# Patient Record
Sex: Female | Born: 1935 | Hispanic: Yes | Smoking: Never smoker
Health system: Southern US, Community
[De-identification: ages and names within clinical notes are randomized; demographics above are authoritative.]

## PROBLEM LIST (undated history)

## (undated) DIAGNOSIS — E119 Type 2 diabetes mellitus without complications: Secondary | ICD-10-CM

## (undated) HISTORY — PX: NO PAST SURGERIES: SHX2092

---

## 2010-11-02 ENCOUNTER — Emergency Department (HOSPITAL_COMMUNITY): Payer: Self-pay

## 2010-11-02 ENCOUNTER — Emergency Department (HOSPITAL_COMMUNITY)
Admission: EM | Admit: 2010-11-02 | Discharge: 2010-11-02 | Disposition: A | Discharge status: Home / Self Care | Payer: Self-pay | Attending: Emergency Medicine | Admitting: Emergency Medicine

## 2010-11-02 DIAGNOSIS — R509 Fever, unspecified: Secondary | ICD-10-CM | POA: Insufficient documentation

## 2010-11-02 DIAGNOSIS — N390 Urinary tract infection, site not specified: Secondary | ICD-10-CM | POA: Insufficient documentation

## 2010-11-02 DIAGNOSIS — E119 Type 2 diabetes mellitus without complications: Secondary | ICD-10-CM | POA: Insufficient documentation

## 2010-11-02 DIAGNOSIS — R51 Headache: Secondary | ICD-10-CM | POA: Insufficient documentation

## 2010-11-02 DIAGNOSIS — R079 Chest pain, unspecified: Secondary | ICD-10-CM | POA: Insufficient documentation

## 2010-11-02 DIAGNOSIS — Z79899 Other long term (current) drug therapy: Secondary | ICD-10-CM | POA: Insufficient documentation

## 2010-11-02 DIAGNOSIS — R10813 Right lower quadrant abdominal tenderness: Secondary | ICD-10-CM | POA: Insufficient documentation

## 2010-11-02 LAB — CBC
HCT: 39.1 % (ref 36.0–46.0)
Hemoglobin: 13.8 g/dL (ref 12.0–15.0)
MCH: 30.9 pg (ref 26.0–34.0)
MCV: 87.5 fL (ref 78.0–100.0)
RBC: 4.47 MIL/uL (ref 3.87–5.11)

## 2010-11-02 LAB — DIFFERENTIAL
Eosinophils Absolute: 0 10*3/uL (ref 0.0–0.7)
Lymphocytes Relative: 33 % (ref 12–46)
Lymphs Abs: 1.9 10*3/uL (ref 0.7–4.0)
Monocytes Relative: 5 % (ref 3–12)
Neutro Abs: 3.5 10*3/uL (ref 1.7–7.7)
Neutrophils Relative %: 61 % (ref 43–77)

## 2010-11-02 LAB — URINALYSIS, ROUTINE W REFLEX MICROSCOPIC
Bilirubin Urine: NEGATIVE
Glucose, UA: NEGATIVE mg/dL
Protein, ur: NEGATIVE mg/dL
Urobilinogen, UA: 0.2 mg/dL (ref 0.0–1.0)

## 2010-11-02 LAB — COMPREHENSIVE METABOLIC PANEL
ALT: 16 U/L (ref 0–35)
AST: 20 U/L (ref 0–37)
Albumin: 3.9 g/dL (ref 3.5–5.2)
CO2: 24 mEq/L (ref 19–32)
Chloride: 101 mEq/L (ref 96–112)
GFR calc non Af Amer: >60 mL/min (ref >60)
Potassium: 4 mEq/L (ref 3.5–5.1)
Sodium: 138 mEq/L (ref 135–145)
Total Bilirubin: 0.3 mg/dL (ref 0.3–1.2)

## 2010-11-02 LAB — URINE MICROSCOPIC-ADD ON

## 2010-11-02 LAB — TROPONIN I: Troponin I: <0.3 ng/mL (ref <0.30)

## 2010-11-02 MED ORDER — IOHEXOL 300 MG/ML  SOLN
100.0000 mL | Freq: Once | INTRAMUSCULAR | Status: AC | PRN
  Administered 2010-11-02: 100 mL via INTRAVENOUS

## 2010-11-05 LAB — URINE CULTURE: Culture  Setup Time: 201209300216

## 2012-09-15 IMAGING — CR DG CHEST 2V
2 series · 2 of 2 positions shown · non-contrast
Comparison: None.

CLINICAL DATA: 76-year-old female with nausea and chest pain.

CHEST - 2 VIEW

[w chest lat]
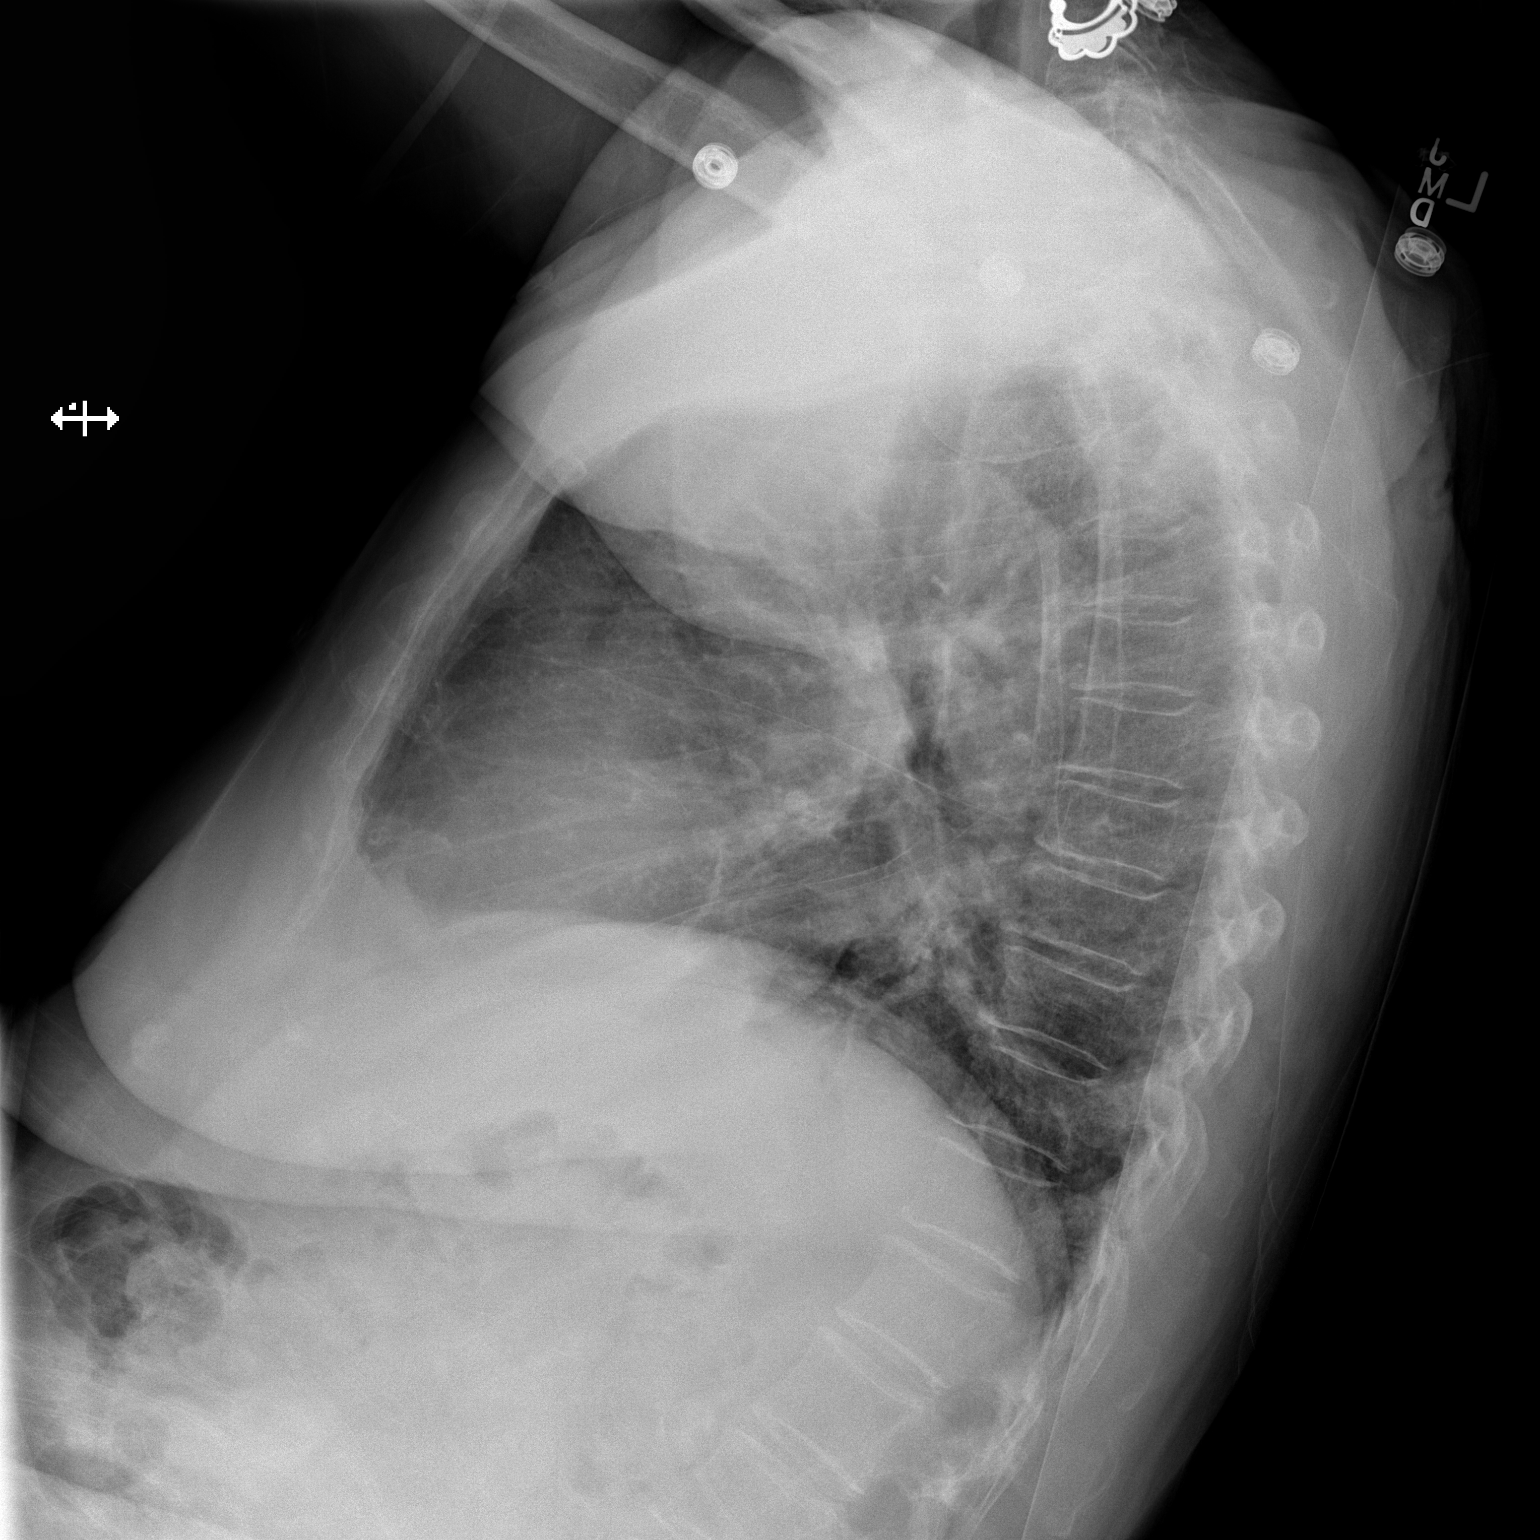

[x chest ap]
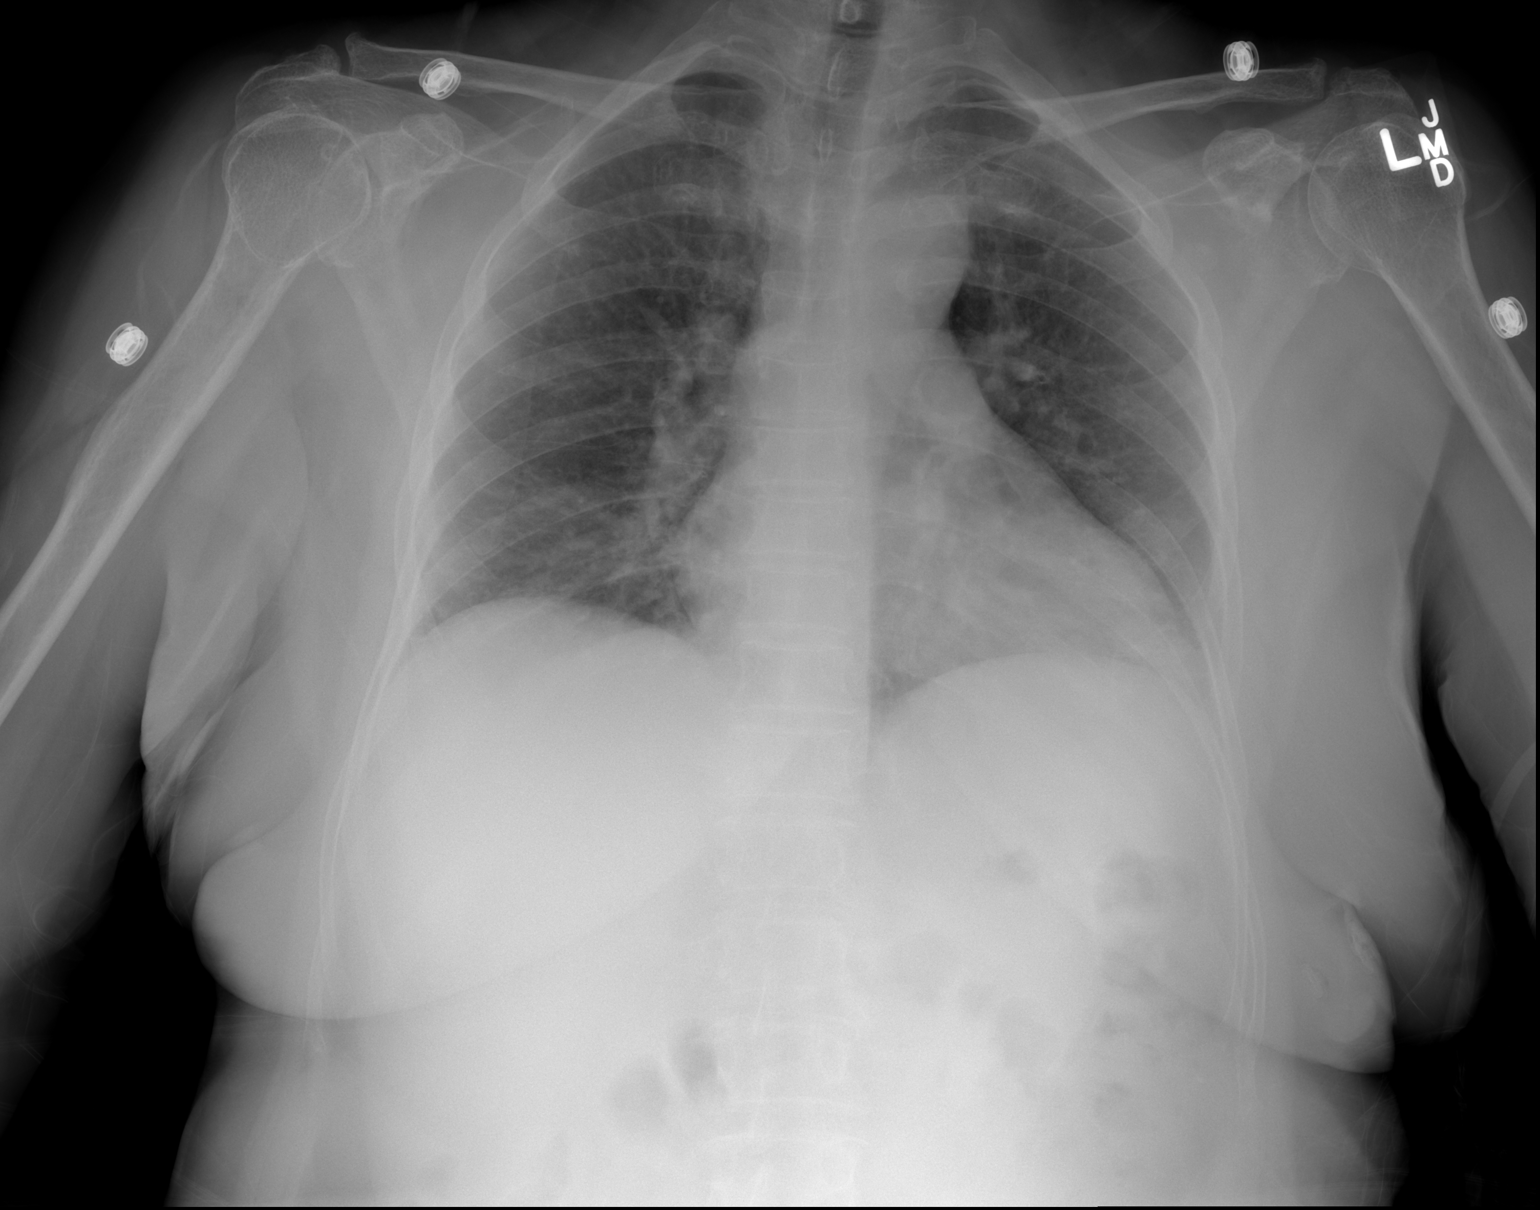

[2 of 2 positions shown; findings below may reference images not displayed]

FINDINGS: Low lung volumes.  Streaky retrocardiac opacity.  Cardiac
size and mediastinal contours are within normal limits.  Visualized
tracheal air column is within normal limits.  No pneumothorax,
pulmonary edema or pleural effusion.  No pneumoperitoneum. No acute
osseous abnormality identified.
IMPRESSION: Low lung volumes with retrocardiac atelectasis.

## 2014-03-14 ENCOUNTER — Inpatient Hospital Stay (HOSPITAL_COMMUNITY): Payer: Self-pay

## 2014-03-14 ENCOUNTER — Inpatient Hospital Stay (HOSPITAL_COMMUNITY)
Admission: AD | Admit: 2014-03-14 | Discharge: 2014-03-14 | Disposition: A | Discharge status: Home / Self Care | Payer: Self-pay | Source: Ambulatory Visit | Attending: Family Medicine | Admitting: Family Medicine

## 2014-03-14 ENCOUNTER — Encounter (HOSPITAL_COMMUNITY): Payer: Self-pay | Admitting: General Practice

## 2014-03-14 DIAGNOSIS — N390 Urinary tract infection, site not specified: Secondary | ICD-10-CM | POA: Insufficient documentation

## 2014-03-14 DIAGNOSIS — R102 Pelvic and perineal pain: Secondary | ICD-10-CM

## 2014-03-14 DIAGNOSIS — R51 Headache: Secondary | ICD-10-CM | POA: Insufficient documentation

## 2014-03-14 DIAGNOSIS — E780 Pure hypercholesterolemia: Secondary | ICD-10-CM | POA: Insufficient documentation

## 2014-03-14 DIAGNOSIS — R319 Hematuria, unspecified: Secondary | ICD-10-CM

## 2014-03-14 DIAGNOSIS — J0100 Acute maxillary sinusitis, unspecified: Secondary | ICD-10-CM | POA: Insufficient documentation

## 2014-03-14 DIAGNOSIS — E119 Type 2 diabetes mellitus without complications: Secondary | ICD-10-CM | POA: Insufficient documentation

## 2014-03-14 DIAGNOSIS — R112 Nausea with vomiting, unspecified: Secondary | ICD-10-CM | POA: Insufficient documentation

## 2014-03-14 HISTORY — DX: Type 2 diabetes mellitus without complications: E11.9

## 2014-03-14 LAB — URINALYSIS, ROUTINE W REFLEX MICROSCOPIC
Bilirubin Urine: NEGATIVE
Glucose, UA: 500 mg/dL — AB
Ketones, ur: 15 mg/dL — AB
NITRITE: NEGATIVE
PROTEIN: NEGATIVE mg/dL
Specific Gravity, Urine: >1.03 — ABNORMAL HIGH (ref 1.005–1.030)
UROBILINOGEN UA: 0.2 mg/dL (ref 0.0–1.0)
pH: 5.5 (ref 5.0–8.0)

## 2014-03-14 LAB — COMPREHENSIVE METABOLIC PANEL
ALBUMIN: 4.4 g/dL (ref 3.5–5.2)
ALK PHOS: 116 U/L (ref 39–117)
ALT: 20 U/L (ref 0–35)
AST: 21 U/L (ref 0–37)
Anion gap: 6 (ref 5–15)
BUN: 18 mg/dL (ref 6–23)
CO2: 27 mmol/L (ref 19–32)
Calcium: 9.3 mg/dL (ref 8.4–10.5)
Chloride: 105 mmol/L (ref 96–112)
Creatinine, Ser: 0.79 mg/dL (ref 0.50–1.10)
GFR calc Af Amer: >90 mL/min (ref >90)
GFR calc non Af Amer: 78 mL/min — ABNORMAL LOW (ref >90)
Glucose, Bld: 217 mg/dL — ABNORMAL HIGH (ref 70–99)
POTASSIUM: 4.1 mmol/L (ref 3.5–5.1)
SODIUM: 138 mmol/L (ref 135–145)
TOTAL PROTEIN: 7.7 g/dL (ref 6.0–8.3)
Total Bilirubin: 0.2 mg/dL — ABNORMAL LOW (ref 0.3–1.2)

## 2014-03-14 LAB — URINE MICROSCOPIC-ADD ON

## 2014-03-14 LAB — CBC WITH DIFFERENTIAL/PLATELET
BASOS ABS: 0 10*3/uL (ref 0.0–0.1)
BASOS PCT: 1 % (ref 0–1)
Eosinophils Absolute: 0.2 10*3/uL (ref 0.0–0.7)
Eosinophils Relative: 4 % (ref 0–5)
HEMATOCRIT: 39.4 % (ref 36.0–46.0)
Hemoglobin: 13.9 g/dL (ref 12.0–15.0)
Lymphocytes Relative: 35 % (ref 12–46)
Lymphs Abs: 1.9 10*3/uL (ref 0.7–4.0)
MCH: 31.8 pg (ref 26.0–34.0)
MCHC: 35.3 g/dL (ref 30.0–36.0)
MCV: 90.2 fL (ref 78.0–100.0)
MONO ABS: 0.6 10*3/uL (ref 0.1–1.0)
Monocytes Relative: 11 % (ref 3–12)
NEUTROS ABS: 2.7 10*3/uL (ref 1.7–7.7)
NEUTROS PCT: 49 % (ref 43–77)
PLATELETS: 273 10*3/uL (ref 150–400)
RBC: 4.37 MIL/uL (ref 3.87–5.11)
RDW: 12.8 % (ref 11.5–15.5)
WBC: 5.4 10*3/uL (ref 4.0–10.5)

## 2014-03-14 LAB — WET PREP, GENITAL
Clue Cells Wet Prep HPF POC: NONE SEEN
Trich, Wet Prep: NONE SEEN
YEAST WET PREP: NONE SEEN

## 2014-03-14 MED ORDER — CEFTRIAXONE SODIUM 1 G IJ SOLR
1.0000 g | Freq: Once | INTRAMUSCULAR | Status: AC
  Administered 2014-03-14: 1 g via INTRAMUSCULAR
  Filled 2014-03-14: qty 10

## 2014-03-14 MED ORDER — AMOXICILLIN-POT CLAVULANATE 875-125 MG PO TABS: 1.0000 | ORAL_TABLET | Freq: Two times a day (BID) | ORAL | Status: DC

## 2014-03-14 MED ORDER — PROMETHAZINE HCL 25 MG PO TABS: 12.5000 mg | ORAL_TABLET | Freq: Four times a day (QID) | ORAL | Status: DC | PRN

## 2014-03-14 MED ORDER — ONDANSETRON 4 MG PO TBDP
4.0000 mg | ORAL_TABLET | Freq: Once | ORAL | Status: AC
  Administered 2014-03-14: 4 mg via ORAL
  Filled 2014-03-14: qty 1

## 2014-03-14 MED ORDER — ACETAMINOPHEN 325 MG PO TABS
650.0000 mg | ORAL_TABLET | Freq: Once | ORAL | Status: AC
  Administered 2014-03-14: 650 mg via ORAL
  Filled 2014-03-14: qty 2

## 2014-03-14 NOTE — Progress Notes (Signed)
I assisted Dr Adrian BlackwaterStinson  With some questions, by Orlan LeavensViria Alvarez Spanish Interpreter

## 2014-03-14 NOTE — MAU Note (Signed)
Pt states headache x 3 days.

## 2014-03-14 NOTE — MAU Note (Addendum)
Has a headache and her eyes hurst, started on Sunday.  Eyes are not red, just watery. No hx of migraines.  Pain in lower abd, burning with urniation started about 3 wks.

## 2014-03-14 NOTE — Progress Notes (Signed)
I assisted RN and Physician with some questions, BY Orlan LeavensViria Alvarez Spanish Interpreter

## 2014-03-14 NOTE — Progress Notes (Signed)
I assisted Dr Mick SellHope Neesse and nurse with some discharges information, by Orlan LeavensViria Alvarez Spanish Interpreter

## 2014-03-14 NOTE — MAU Provider Note (Signed)
CSN: [id number]     Arrival date & time 03/14/14  1617 History   None    Chief Complaint  Patient presents with  . Headache  . Abdominal Pain     (Consider location/radiation/quality/duration/timing/severity/associated sxs/prior Treatment) Patient is a 80 y.o. female presenting with abdominal pain. The history is provided by the patient. The history is limited by a language barrier. A language interpreter was used.  Abdominal Pain The primary symptoms of the illness include abdominal pain, fever, nausea, vomiting and dysuria. The primary symptoms of the illness do not include shortness of breath, vaginal discharge or vaginal bleeding. The current episode started 2 days ago. The onset of the illness was gradual.  The dysuria is associated with frequency and urgency.  The patient states that she believes she is currently not pregnant. The patient has not had a change in bowel habit. Additional symptoms associated with the illness include chills, urgency and frequency. Symptoms associated with the illness do not include back pain. Significant associated medical issues include diabetes.   Leydi Bones is a 80 y.o. female who presents to the MAU with abdominal pain, fever and vomiting that started yesterday. She has been able to keep down some food today. She states that she has also had headache and swelling of her face. She does not have a PCP. She gets her care in the ED's or when she goes to [place]. PMH IDDD, hypercholesterolemia  Past Medical History  Diagnosis Date  . Diabetes mellitus without complication    Past Surgical History  Procedure Laterality Date  . No past surgeries     History reviewed. No pertinent family history. History  Substance Use Topics  . Smoking status: Never Smoker   . Smokeless tobacco: Never Used  . Alcohol Use: No   OB History    Gravida Para Term Preterm AB TAB SAB Ectopic Multiple Living            1     Review of Systems  Constitutional:  Positive for fever and chills.  HENT: Positive for congestion and sinus pressure.   Eyes: Negative for pain and redness.  Respiratory: Positive for cough. Negative for shortness of breath.   Gastrointestinal: Positive for nausea, vomiting and abdominal pain.  Genitourinary: Positive for dysuria, urgency and frequency. Negative for vaginal bleeding and vaginal discharge.  Musculoskeletal: Negative for back pain.  Skin: Negative for rash.  Neurological: Negative for dizziness. Headaches: sinus.  Psychiatric/Behavioral: Negative for confusion. The patient is not nervous/anxious.       Allergies  Review of patient's allergies indicates no known allergies.  Home Medications   Prior to Admission medications   Medication Sig Start Date End Date Taking? Authorizing Provider  glyBURIDE (DIABETA) 5 MG tablet Take 5 mg by mouth 2 (two) times daily with a meal.   Yes Historical Provider, MD  ibuprofen (ADVIL,MOTRIN) 200 MG tablet Take 400 mg by mouth 2 (two) times daily as needed for headache.   Yes Historical Provider, MD  metFORMIN (GLUCOPHAGE) 850 MG tablet Take 850 mg by mouth 2 (two) times daily with a meal.   Yes Historical Provider, MD  Pseudoephedrine-APAP-DM (TYLENOL FLU PO) Take 15 mLs by mouth daily as needed (For flu symptoms.).   Yes Historical Provider, MD  amoxicillin-clavulanate (AUGMENTIN) 875-125 MG per tablet Take 1 tablet by mouth 2 (two) times daily. 03/14/14   Hope Orlene Och, NP  promethazine (PHENERGAN) 25 MG tablet Take 0.5 tablets (12.5 mg total) by mouth every 6 (six)  hours as needed for nausea. 03/14/14   Hope Orlene Och, NP   BP 118/62 mmHg  Pulse 82  Temp(Src) 98.2 F (36.8 C) (Oral)  Resp 18  SpO2 99% Physical Exam  Constitutional: She is oriented to person, place, and time. She appears well-developed and well-nourished. No distress.  HENT:  Head: Normocephalic.  Nose: Right sinus exhibits maxillary sinus tenderness. Left sinus exhibits maxillary sinus tenderness.   Mouth/Throat: Uvula is midline, oropharynx is clear and moist and mucous membranes are normal.  Eyes: Conjunctivae and EOM are normal.  Neck: Normal range of motion. Neck supple.  Cardiovascular: Normal rate and regular rhythm.   Pulmonary/Chest: Effort normal. She has no wheezes. She has no rales.  Abdominal: Soft. Bowel sounds are normal. There is tenderness in the suprapubic area. There is no rebound, no guarding and no CVA tenderness.  Genitourinary:  Strong smell of urine. External genitalia without lesions, scant d/c vaginal vault. No CMT, mildly tender bilateral adnexa. No uterine enlargement.   Musculoskeletal: Normal range of motion.  Neurological: She is alert and oriented to person, place, and time. No cranial nerve deficit.  Skin: Skin is warm and dry.  Psychiatric: She has a normal mood and affect. Her behavior is normal.  Nursing note and vitals reviewed.   ED Course  Procedures  Results for orders placed or performed during the hospital encounter of 03/14/14 (from the past 24 hour(s))  Urinalysis, Routine w reflex microscopic     Status: Abnormal   Collection Time: 03/14/14  4:45 PM  Result Value Ref Range   Color, Urine YELLOW YELLOW   APPearance HAZY (A) CLEAR   Specific Gravity, Urine >1.030 (H) 1.005 - 1.030   pH 5.5 5.0 - 8.0   Glucose, UA 500 (A) NEGATIVE mg/dL   Hgb urine dipstick MODERATE (A) NEGATIVE   Bilirubin Urine NEGATIVE NEGATIVE   Ketones, ur 15 (A) NEGATIVE mg/dL   Protein, ur NEGATIVE NEGATIVE mg/dL   Urobilinogen, UA 0.2 0.0 - 1.0 mg/dL   Nitrite NEGATIVE NEGATIVE   Leukocytes, UA MODERATE (A) NEGATIVE  Urine microscopic-add on     Status: Abnormal   Collection Time: 03/14/14  4:45 PM  Result Value Ref Range   Squamous Epithelial / LPF MANY (A) RARE   WBC, UA TOO NUMEROUS TO COUNT <3 WBC/hpf   RBC / HPF 3-6 <3 RBC/hpf   Bacteria, UA MANY (A) RARE  CBC with Differential/Platelet     Status: None   Collection Time: 03/14/14  4:53 PM  Result  Value Ref Range   WBC 5.4 4.0 - 10.5 K/uL   RBC 4.37 3.87 - 5.11 MIL/uL   Hemoglobin 13.9 12.0 - 15.0 g/dL   HCT 16.1 09.6 - 04.5 %   MCV 90.2 78.0 - 100.0 fL   MCH 31.8 26.0 - 34.0 pg   MCHC 35.3 30.0 - 36.0 g/dL   RDW 40.9 81.1 - 91.4 %   Platelets 273 150 - 400 K/uL   Neutrophils Relative % 49 43 - 77 %   Neutro Abs 2.7 1.7 - 7.7 K/uL   Lymphocytes Relative 35 12 - 46 %   Lymphs Abs 1.9 0.7 - 4.0 K/uL   Monocytes Relative 11 3 - 12 %   Monocytes Absolute 0.6 0.1 - 1.0 K/uL   Eosinophils Relative 4 0 - 5 %   Eosinophils Absolute 0.2 0.0 - 0.7 K/uL   Basophils Relative 1 0 - 1 %   Basophils Absolute 0.0 0.0 - 0.1 K/uL  Comprehensive metabolic panel     Status: Abnormal   Collection Time: 03/14/14  4:53 PM  Result Value Ref Range   Sodium 138 135 - 145 mmol/L   Potassium 4.1 3.5 - 5.1 mmol/L   Chloride 105 96 - 112 mmol/L   CO2 27 19 - 32 mmol/L   Glucose, Bld 217 (H) 70 - 99 mg/dL   BUN 18 6 - 23 mg/dL   Creatinine, Ser 1.610.79 0.50 - 1.10 mg/dL   Calcium 9.3 8.4 - 09.610.5 mg/dL   Total Protein 7.7 6.0 - 8.3 g/dL   Albumin 4.4 3.5 - 5.2 g/dL   AST 21 0 - 37 U/L   ALT 20 0 - 35 U/L   Alkaline Phosphatase 116 39 - 117 U/L   Total Bilirubin 0.2 (L) 0.3 - 1.2 mg/dL   GFR calc non Af Amer 78 (L) >90 mL/min   GFR calc Af Amer >90 >90 mL/min   Anion gap 6 5 - 15  Wet prep, genital     Status: Abnormal   Collection Time: 03/14/14  5:20 PM  Result Value Ref Range   Yeast Wet Prep HPF POC NONE SEEN NONE SEEN   Trich, Wet Prep NONE SEEN NONE SEEN   Clue Cells Wet Prep HPF POC NONE SEEN NONE SEEN   WBC, Wet Prep HPF POC FEW (A) NONE SEEN    Dg Chest 2 View  03/14/2014   CLINICAL DATA:  Cough and fever  EXAM: CHEST  2 VIEW  COMPARISON:  11/02/2010  FINDINGS: Normal heart size and mediastinal contours. Chronic subtle density at the peripheral left base, scarring based on abdominal CT 11/02/2010. No acute infiltrate or edema. No effusion or pneumothorax. No acute osseous findings.   IMPRESSION: No active cardiopulmonary disease.   Electronically Signed   By: Marnee SpringJonathon  Watts M.D.   On: 03/14/2014 20:06   [place] Transvaginal Non-ob  03/14/2014   CLINICAL DATA:  Pelvic pain for 1 month.  EXAM: TRANSABDOMINAL AND TRANSVAGINAL ULTRASOUND OF PELVIS  TECHNIQUE: Both transabdominal and transvaginal ultrasound examinations of the pelvis were performed. Transabdominal technique was performed for global imaging of the pelvis including uterus, ovaries, adnexal regions, and pelvic cul-de-sac. It was necessary to proceed with endovaginal exam following the transabdominal exam to visualize the endometrium and ovaries.  COMPARISON:  None  FINDINGS: Uterus  Measurements: 5.7 x 2.5 x 2.0 cm. No fibroids or other mass visualized.  Endometrium  Thickness: 3 mm which is within normal limit. No focal abnormality visualized.  Right ovary  Not visualized.  No adnexal mass or other abnormality seen.  Left ovary  Not visualized.  No adnexal mass or other abnormality seen.  Other findings  No free fluid.  IMPRESSION: Ovaries are not visualized. No adnexal mass or other abnormality seen. No significant abnormality is noted in the pelvis.   Electronically Signed   By: Lupita RaiderJames  Green Jr, M.D.   On: 03/14/2014 18:14   [place] Pelvis Complete  03/14/2014   CLINICAL DATA:  Pelvic pain for 1 month.  EXAM: TRANSABDOMINAL AND TRANSVAGINAL ULTRASOUND OF PELVIS  TECHNIQUE: Both transabdominal and transvaginal ultrasound examinations of the pelvis were performed. Transabdominal technique was performed for global imaging of the pelvis including uterus, ovaries, adnexal regions, and pelvic cul-de-sac. It was necessary to proceed with endovaginal exam following the transabdominal exam to visualize the endometrium and ovaries.  COMPARISON:  None  FINDINGS: Uterus  Measurements: 5.7 x 2.5 x 2.0 cm. No fibroids or other mass visualized.  Endometrium  Thickness: 3 mm which is within normal limit. No focal abnormality visualized.  Right ovary  Not  visualized.  No adnexal mass or other abnormality seen.  Left ovary  Not visualized.  No adnexal mass or other abnormality seen.  Other findings  No free fluid.  IMPRESSION: Ovaries are not visualized. No adnexal mass or other abnormality seen. No significant abnormality is noted in the pelvis.   Electronically Signed   By: Lupita Raider, M.D.   On: 03/14/2014 18:14    This patient was examined by me and by Dr. Adrian Blackwater. Treated with Rocephin 1 gram IM and tylenol prior to d/c. Tylenol did help her headache. She has been able to eat and drink without n/v here in MAU.  MDM  80 y.o. female with UTI symptoms, n/v (that has resolved) and sinus headache x 2 days. Stable for d/c with normal pelvic u/s, normal chest x-ray and labs. Patient is not toxic appearing, does not appear dehydrated and has no CVA tenderness.  Urine pos for UTI. Will treat out patient with Augmentin 875 PO BID and phenergan 12.5 mg PO for nausea if needed. I have reviewed this patient's vital signs, nurses notes, appropriate labs and imaging.  Discussed with the patient and her daughter results and plan of care using the Spanish translator here in the hospital. Patient voices understanding and agrees with plan. I discussed importance of having a PCP here in the [place] and patient given information about [facility].  Final diagnoses:  Urinary tract infection with hematuria, site unspecified  Acute maxillary sinusitis, recurrence not specified

## 2014-03-16 LAB — URINE CULTURE

## 2015-06-04 DEATH — deceased

## 2016-01-26 IMAGING — DX DG CHEST 2V
2 series · 2 of 2 positions shown · non-contrast
Comparison: 11/02/2010

CLINICAL DATA: Cough and fever

EXAM:
CHEST  2 VIEW

[chest pa]
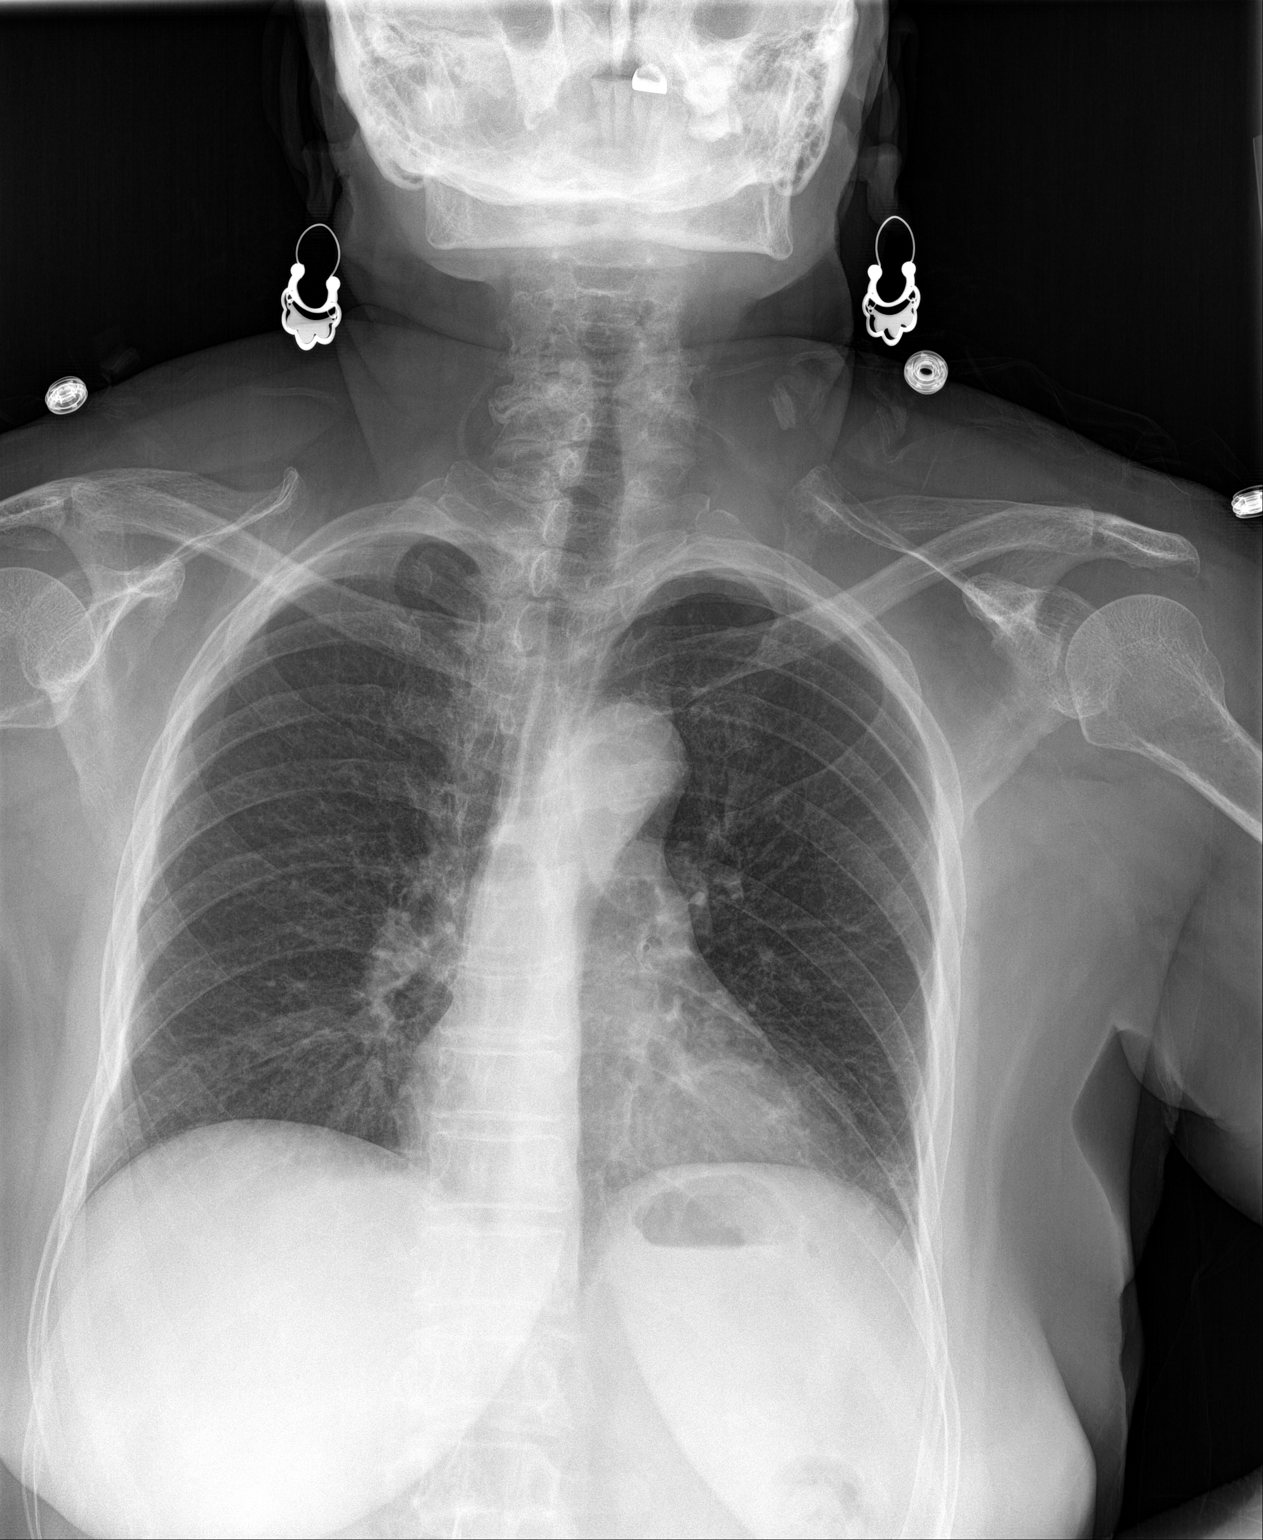

[chest lat]
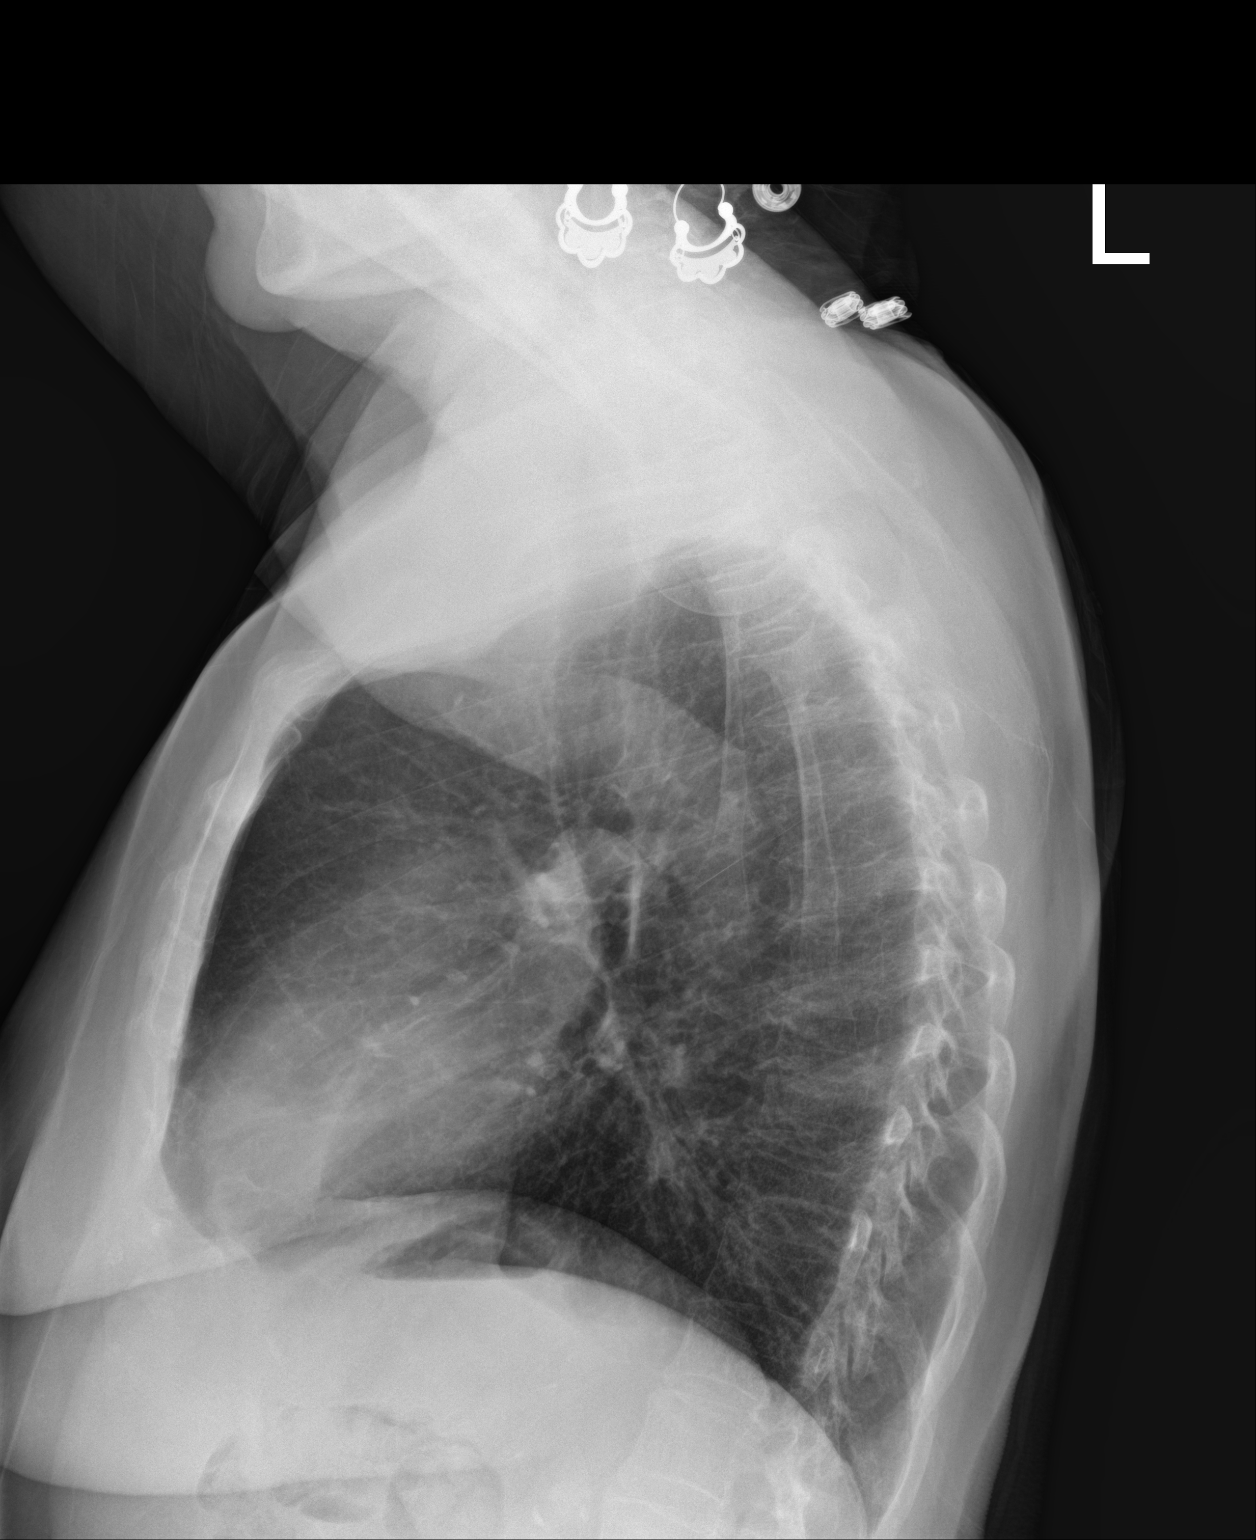

[2 of 2 positions shown; findings below may reference images not displayed]

FINDINGS: Normal heart size and mediastinal contours. Chronic subtle density
at the peripheral left base, scarring based on abdominal CT
11/02/2010. No acute infiltrate or edema. No effusion or
pneumothorax. No acute osseous findings.
IMPRESSION: No active cardiopulmonary disease.

## 2020-09-13 ENCOUNTER — Emergency Department (HOSPITAL_COMMUNITY): Payer: No Typology Code available for payment source

## 2020-09-13 ENCOUNTER — Emergency Department (HOSPITAL_COMMUNITY)
Admission: EM | Admit: 2020-09-13 | Discharge: 2020-09-13 | Disposition: A | Discharge status: Home / Self Care | Payer: No Typology Code available for payment source | Attending: Emergency Medicine | Admitting: Emergency Medicine

## 2020-09-13 ENCOUNTER — Other Ambulatory Visit: Payer: Self-pay

## 2020-09-13 ENCOUNTER — Encounter (HOSPITAL_COMMUNITY): Payer: Self-pay

## 2020-09-13 DIAGNOSIS — Z7984 Long term (current) use of oral hypoglycemic drugs: Secondary | ICD-10-CM | POA: Diagnosis not present

## 2020-09-13 DIAGNOSIS — M25561 Pain in right knee: Secondary | ICD-10-CM | POA: Diagnosis not present

## 2020-09-13 DIAGNOSIS — E119 Type 2 diabetes mellitus without complications: Secondary | ICD-10-CM | POA: Diagnosis not present

## 2020-09-13 DIAGNOSIS — M25511 Pain in right shoulder: Secondary | ICD-10-CM

## 2020-09-13 DIAGNOSIS — Y92410 Unspecified street and highway as the place of occurrence of the external cause: Secondary | ICD-10-CM | POA: Diagnosis not present

## 2020-09-13 DIAGNOSIS — S0990XA Unspecified injury of head, initial encounter: Secondary | ICD-10-CM | POA: Insufficient documentation

## 2020-09-13 DIAGNOSIS — M25562 Pain in left knee: Secondary | ICD-10-CM | POA: Insufficient documentation

## 2020-09-13 DIAGNOSIS — M7918 Myalgia, other site: Secondary | ICD-10-CM

## 2020-09-13 DIAGNOSIS — U071 COVID-19: Secondary | ICD-10-CM

## 2020-09-13 LAB — RESP PANEL BY RT-PCR (FLU A&B, COVID) ARPGX2
Influenza A by PCR: NEGATIVE
Influenza B by PCR: NEGATIVE
SARS Coronavirus 2 by RT PCR: POSITIVE — AB

## 2020-09-13 LAB — COMPREHENSIVE METABOLIC PANEL
ALT: 15 U/L (ref 0–44)
AST: 19 U/L (ref 15–41)
Albumin: 3.9 g/dL (ref 3.5–5.0)
Alkaline Phosphatase: 95 U/L (ref 38–126)
Anion gap: 7 (ref 5–15)
BUN: 12 mg/dL (ref 8–23)
CO2: 25 mmol/L (ref 22–32)
Calcium: 9.1 mg/dL (ref 8.9–10.3)
Chloride: 103 mmol/L (ref 98–111)
Creatinine, Ser: 0.77 mg/dL (ref 0.44–1.00)
GFR, Estimated: >60 mL/min (ref >60)
Glucose, Bld: 258 mg/dL — ABNORMAL HIGH (ref 70–99)
Potassium: 4.2 mmol/L (ref 3.5–5.1)
Sodium: 135 mmol/L (ref 135–145)
Total Bilirubin: 0.8 mg/dL (ref 0.3–1.2)
Total Protein: 7.1 g/dL (ref 6.5–8.1)

## 2020-09-13 LAB — CBC
HCT: 37.2 % (ref 36.0–46.0)
Hemoglobin: 13.2 g/dL (ref 12.0–15.0)
MCH: 32 pg (ref 26.0–34.0)
MCHC: 35.5 g/dL (ref 30.0–36.0)
MCV: 90.3 fL (ref 80.0–100.0)
Platelets: 199 10*3/uL (ref 150–400)
RBC: 4.12 MIL/uL (ref 3.87–5.11)
RDW: 12.5 % (ref 11.5–15.5)
WBC: 3.5 10*3/uL — ABNORMAL LOW (ref 4.0–10.5)
nRBC: 0 % (ref 0.0–0.2)

## 2020-09-13 LAB — CBG MONITORING, ED: Glucose-Capillary: 233 mg/dL — ABNORMAL HIGH (ref 70–99)

## 2020-09-13 MED ORDER — ACETAMINOPHEN 500 MG PO TABS
1000.0000 mg | ORAL_TABLET | Freq: Once | ORAL | Status: AC
  Administered 2020-09-13: 1000 mg via ORAL
  Filled 2020-09-13: qty 2

## 2020-09-13 NOTE — Discharge Instructions (Addendum)
Recommend follow-up with your PCP for reevaluation in 3 days.  Also recommend Tylenol and ibuprofen for pain control.
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
## 2020-09-13 NOTE — ED Notes (Signed)
Pt ambulated with no assistance to bathroom
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
## 2020-09-15 ENCOUNTER — Telehealth (HOSPITAL_BASED_OUTPATIENT_CLINIC_OR_DEPARTMENT_OTHER): Payer: Self-pay | Admitting: Emergency Medicine

## 2020-09-15 NOTE — Telephone Encounter (Signed)
Call placed to inform the patient of her positive COVID-19 PCR results.  Voicemail left for the patient at the phone number listed in her medical record.

## 2020-10-09 ENCOUNTER — Other Ambulatory Visit: Payer: Self-pay

## 2020-10-09 ENCOUNTER — Ambulatory Visit: Payer: Self-pay | Admitting: Physician Assistant

## 2020-10-09 VITALS — BP 120/71 | HR 84 | Temp 99.0°F | Resp 18 | Ht <= 58 in | Wt 120.0 lb

## 2020-10-09 DIAGNOSIS — Z8616 Personal history of COVID-19: Secondary | ICD-10-CM

## 2020-10-09 DIAGNOSIS — Z6827 Body mass index (BMI) 27.0-27.9, adult: Secondary | ICD-10-CM

## 2020-10-09 DIAGNOSIS — R059 Cough, unspecified: Secondary | ICD-10-CM

## 2020-10-09 DIAGNOSIS — E1165 Type 2 diabetes mellitus with hyperglycemia: Secondary | ICD-10-CM

## 2020-10-09 DIAGNOSIS — R011 Cardiac murmur, unspecified: Secondary | ICD-10-CM

## 2020-10-09 LAB — POCT GLYCOSYLATED HEMOGLOBIN (HGB A1C): Hemoglobin A1C: 9.3 % — AB (ref 4.0–5.6)

## 2020-10-09 MED ORDER — AZITHROMYCIN 250 MG PO TABS
ORAL_TABLET | ORAL | 0 refills | Status: AC
  Filled 2020-10-09: qty 6, 5d supply, fill #0

## 2020-10-09 MED ORDER — BENZONATATE 100 MG PO CAPS
100.0000 mg | ORAL_CAPSULE | Freq: Two times a day (BID) | ORAL | 0 refills | Status: AC | PRN
  Filled 2020-10-09: qty 20, 10d supply, fill #0

## 2020-10-09 MED ORDER — TRUE METRIX BLOOD GLUCOSE TEST VI STRP
1.0000 | ORAL_STRIP | Freq: Two times a day (BID) | 12 refills | Status: AC
  Filled 2020-10-09: qty 100, 50d supply, fill #0

## 2020-10-09 MED ORDER — METFORMIN HCL 500 MG PO TABS
500.0000 mg | ORAL_TABLET | Freq: Two times a day (BID) | ORAL | 1 refills | Status: DC
Start: 2020-10-09 — End: 2022-06-22
  Filled 2020-10-09: qty 60, 30d supply, fill #0

## 2020-10-09 MED ORDER — TRUE METRIX METER W/DEVICE KIT
1.0000 [IU] | PACK | Freq: Two times a day (BID) | 0 refills | Status: AC
Start: 2020-10-09 — End: 2021-10-09
  Filled 2020-10-09: qty 1, 365d supply, fill #0

## 2020-10-09 MED ORDER — TRUEPLUS LANCETS 28G MISC
1.0000 | Freq: Two times a day (BID) | 11 refills | Status: AC
  Filled 2020-10-09: qty 100, 50d supply, fill #0

## 2020-10-09 NOTE — Progress Notes (Signed)
New Patient Office Visit  Subjective:  Patient ID: Beth Robles, female    DOB: 12-31-1934  Age: 86 y.o. MRN: [id number]  CC: No chief complaint on file.   HPI Anysha Buske reports that she was diagnosed with COVID on September 13, 2020.  Reports that she continues to have a lingering productive cough, states it is worse at night, has been using NyQuil without much relief.  Reports that she has been without diabetes medication for "some time" reports that she was told by a provider that she was seen at a medical office on [address] in [place] that she no longer had diabetes and did not need to take medication.  Reports that she went to the health department in [place] approximately 1 month ago and was told she needed to take medication, but states that she did not receive any prescriptions.  States that she does not have a way to check her blood glucose levels at home.  Daughter is present and provides majority of history, patient does live with daughter.  Due to language barrier, an interpreter was present during the history-taking and subsequent discussion (and for part of the physical exam) with this patient.     Past Medical History:  Diagnosis Date   Diabetes mellitus without complication ([place])     Past Surgical History:  Procedure Laterality Date   NO PAST SURGERIES      History reviewed. No pertinent family history.  Social History   Socioeconomic History   Marital status: Significant Other    Spouse name: Not on file   Number of children: Not on file   Years of education: Not on file   Highest education level: Not on file  Occupational History   Not on file  Tobacco Use   Smoking status: Never   Smokeless tobacco: Never  Substance and Sexual Activity   Alcohol use: No   Drug use: No   Sexual activity: Not on file  Other Topics Concern   Not on file  Social History Narrative   Not on file   Social Determinants of Health   Financial  Resource Strain: Not on file  Food Insecurity: Not on file  Transportation Needs: Not on file  Physical Activity: Not on file  Stress: Not on file  Social Connections: Not on file  Intimate Partner Violence: Not on file    ROS Review of Systems  Constitutional:  Negative for chills and fever.  HENT:  Positive for congestion, rhinorrhea and sinus pressure. Negative for ear pain, postnasal drip and sore throat.   Eyes: Negative.   Respiratory:  Positive for cough. Negative for shortness of breath and wheezing.   Cardiovascular:  Negative for chest pain.  Gastrointestinal:  Negative for nausea and vomiting.  Endocrine: Negative.   Genitourinary: Negative.   Musculoskeletal: Negative.   Skin: Negative.   Allergic/Immunologic: Negative.   Neurological: Negative.   Hematological: Negative.   Psychiatric/Behavioral: Negative.     Objective:   Today's Vitals: BP 120/71 (BP Location: Left Arm, Patient Position: Sitting, Cuff Size: Normal)   Pulse 84   Temp 99 F (37.2 C) (Oral)   Resp 18   Ht _0  (1.397 m)   Wt 120 lb (54.4 kg)   SpO2 97%   BMI 27.89 kg/m   Physical Exam Vitals and nursing note reviewed.  Constitutional:      Appearance: Normal appearance.  HENT:     Head: Normocephalic and atraumatic.     Right Ear:  Tympanic membrane, ear canal and external ear normal.     Left Ear: Tympanic membrane, ear canal and external ear normal.     Nose: Nose normal.     Mouth/Throat:     Mouth: Mucous membranes are moist.     Pharynx: Oropharynx is clear.  Eyes:     Extraocular Movements: Extraocular movements intact.     Conjunctiva/sclera: Conjunctivae normal.     Pupils: Pupils are equal, round, and reactive to light.  Cardiovascular:     Rate and Rhythm: Normal rate and regular rhythm.     Heart sounds: Murmur heard.  Pulmonary:     Effort: Pulmonary effort is normal.     Breath sounds: Normal breath sounds. No wheezing.  Musculoskeletal:        General: Normal  range of motion.     Cervical back: Normal range of motion and neck supple.  Skin:    General: Skin is warm and dry.  Neurological:     General: No focal deficit present.     Mental Status: She is alert and oriented to person, place, and time.  Psychiatric:        Mood and Affect: Mood normal.        Behavior: Behavior normal.        Thought Content: Thought content normal.        Judgment: Judgment normal.    Assessment & Plan:   Problem List Items Addressed This Visit       Endocrine   Uncontrolled type 2 diabetes mellitus with hyperglycemia ([place]) - Primary   Relevant Medications   metFORMIN (GLUCOPHAGE) 500 MG tablet   Blood Glucose Monitoring Suppl (TRUE METRIX METER) w/Device KIT   glucose blood (TRUE METRIX BLOOD GLUCOSE TEST) test strip   TRUEplus Lancets 28G MISC   Other Relevant Orders   HgB A1c (Completed)     Other   Murmur, cardiac   History of COVID-19   Relevant Medications   azithromycin (ZITHROMAX) 250 MG tablet   benzonatate (TESSALON) 100 MG capsule   Other Visit Diagnoses     Cough       Relevant Medications   benzonatate (TESSALON) 100 MG capsule   BMI 27.0-27.9,adult           Outpatient Encounter Medications as of 10/09/2020  Medication Sig   azithromycin (ZITHROMAX) 250 MG tablet Take 2 tabs by mouth day 1, then take 1 tab once daily   benzonatate (TESSALON) 100 MG capsule Take 1 capsule (100 mg total) by mouth 2 (two) times daily as needed for cough.   Blood Glucose Monitoring Suppl (TRUE METRIX METER) w/Device KIT Use twice daily   glucose blood (TRUE METRIX BLOOD GLUCOSE TEST) test strip Use twice daily   TRUEplus Lancets 28G MISC Use twice daily   [DISCONTINUED] ibuprofen (ADVIL,MOTRIN) 200 MG tablet Take 400 mg by mouth 2 (two) times daily as needed for headache.   metFORMIN (GLUCOPHAGE) 500 MG tablet Take 1 tablet (500 mg total) by mouth 2 (two) times daily with a meal.   [DISCONTINUED] amoxicillin-clavulanate (AUGMENTIN) 875-125 MG  per tablet Take 1 tablet by mouth 2 (two) times daily.   [DISCONTINUED] glyBURIDE (DIABETA) 5 MG tablet Take 5 mg by mouth 2 (two) times daily with a meal. (Patient not taking: Reported on 10/09/2020)   [DISCONTINUED] metFORMIN (GLUCOPHAGE) 850 MG tablet Take 850 mg by mouth 2 (two) times daily with a meal. (Patient not taking: Reported on 10/09/2020)   [DISCONTINUED] promethazine (PHENERGAN) 25  MG tablet Take 0.5 tablets (12.5 mg total) by mouth every 6 (six) hours as needed for nausea.   [DISCONTINUED] Pseudoephedrine-APAP-DM (TYLENOL FLU PO) Take 15 mLs by mouth daily as needed (For flu symptoms.).   No facility-administered encounter medications on file as of 10/09/2020.  1. Uncontrolled type 2 diabetes mellitus with hyperglycemia (HCC) A1c 9.3.  On review of medical records, patient was previously prescribed metformin 850 mg twice a day and glyburide 5 mg twice a day.  Patient has not been on any medication, in "some time", patient and daughter were unable to quantify time.  Glyburide is contraindicated due to age.  Will start patient on 500 mg metformin twice a day, patient encouraged to check blood glucose at home twice a day, keep a written log and have available for all office visits.  Patient has applied for [facility] health financial assistance, patient was given an appointment to establish care on November 14, 2020 with Dr. Redmond Pulling at [facility].  Red flags given for prompt reevaluation - HgB A1c - metFORMIN (GLUCOPHAGE) 500 MG tablet; Take 1 tablet (500 mg total) by mouth 2 (two) times daily with a meal.  Dispense: 30 tablet; Refill: 1 - Blood Glucose Monitoring Suppl (TRUE METRIX METER) w/Device KIT; Use twice daily  Dispense: 1 kit; Refill: 0 - glucose blood (TRUE METRIX BLOOD GLUCOSE TEST) test strip; Use twice daily  Dispense: 100 each; Refill: 12 - TRUEplus Lancets 28G MISC; Use twice daily  Dispense: 100 each; Refill: 11  2. History of COVID-19 Trial azithromycin, Tessalon  [place].  Patient education given on supportive care - azithromycin (ZITHROMAX) 250 MG tablet; Take 2 tabs by mouth day 1, then take 1 tab once daily  Dispense: 6 tablet; Refill: 0 - benzonatate (TESSALON) 100 MG capsule; Take 1 capsule (100 mg total) by mouth 2 (two) times daily as needed for cough.  Dispense: 20 capsule; Refill: 0  3. Cough  - benzonatate (TESSALON) 100 MG capsule; Take 1 capsule (100 mg total) by mouth 2 (two) times daily as needed for cough.  Dispense: 20 capsule; Refill: 0  4. BMI 27.0-27.9,adult   5. Murmur, cardiac Not noted on medical records, consider cardiology referral upon approval for [facility] financial assistance, asymptomatic   I have reviewed the patient's medical history (PMH, PSH, Social History, Family History, Medications, and allergies) , and have been updated if relevant. I spent 35 minutes reviewing chart and  face to face time with patient.   Follow-up: Return in about 5 weeks (around 11/14/2020) for with Dr. Redmond Pulling at [place].   Loraine Grip Mayers, PA-C

## 2020-10-09 NOTE — Progress Notes (Signed)
Patient has eaten today and has not taken medication today. Patient denies pain at this time. Patient reports coughing with mucous at night. Patient denies diarrhea or vomiting. Patient

## 2020-10-09 NOTE — Patient Instructions (Signed)
To help with your upper respiratory infection and cough, you are going to take azithromycin as directed, and you can use Tessalon Perles to help you with your cough.  You were going to take metformin 500 mg twice a day, I do encourage you to follow a low sugar diet, check your blood sugar levels twice a day, keep a written log and have them available for your next office visit.  Please let us know if there is anything else we can do for you.  Roney Jaffe, PA-C Physician Assistant [facility] [email or web address]   Control de la glucemia, en adultos Blood Glucose Monitoring, Adult Controlar el nivel de azcar en la sangre (glucosa) es una parte importante del tratamiento de la diabetes. El control de la glucemia implica realizar controles regulares como se lo hayan indicado, y Midwife un registro o diario de los [place] a lo largo del [place]. Controlar la glucemia en forma peridica y llevar un registro diario puede: Ayudarlos a usted y al mdico a Theme park manager de control de la diabetes segn sea necesario, lo que incluye medicamentos o insulina. Ayudarlo a usted a comprender de [employer], la actividad fsica, las enfermedades y los medicamentos inciden en la glucemia. Permitirle a usted saber cul es su nivel de glucemia en cualquier momento. Averiguar rpidamente si su nivel de glucemia es bajo (hipoglucemia) o alto (hiperglucemia). El [employer] objetivos personalizados de su tratamiento. Los objetivos estarn basados en su edad, en otras afecciones que tenga y en cmo responde al tratamiento de la diabetes. Generalmente, el objetivo del tratamiento es [employer] siguientes niveles de glucemia: Antes de las comidas (preprandial): de 80 a 130 mg/dl (de 4.4 a 7.2 mmol/l). Despus de las comidas (posprandial): por debajo de 180 mg/dl (10 mmol/l). Nivel de A1c: menos del 7 %. Materiales  necesarios: Medidor de glucemia. Tiras reactivas para el medidor. Cada medidor tiene sus propias tiras reactivas. Debe usar las tiras reactivas que trajo su medidor. Una aguja para pincharse el dedo (lanceta). No use una lanceta ms de una vez. Un dispositivo que sujeta la lanceta (dispositivo de puncin). Un diario o cuaderno de anotaciones para Education officer, environmental. Cmo controlar su glucemia Controlarse la glucemia  [employer] durante al menos 20 segundos con agua y [place]. Pnchese el costado del dedo (no en la punta) con la lanceta. No use el mismo dedo de forma consecutiva. Frote suavemente el dedo [employer] aparezca una pequea gota de [place]. Siga las instrucciones que vienen con el medidor para Public affairs consultant tira Firefighter, Contractor la sangre sobre la tira y usar el medidor de glucemia. Anote su resultado y las observaciones que desee en su registro. Usar sitios alternativos Algunos medidores le permiten tomar sangre para la prueba de otras zonas del cuerpo que no son el dedo (sitios alternativos). Los sitios alternativos ms frecuentes son Marvis Mo[place], el muslo y la palma de la mano. Es posible que los sitios alternativos no sean tan precisos como los dedos porque el flujo de sangre es ms lento en esas zonas. Esto significa que el resultado que obtiene de estas zonas puede estar retrasado y ser un poco diferente del resultado que obtendra de su dedo. Use el dedo solamente, y no utilice sitios alternativos si: Cree que tiene hipoglucemia. A veces no sabe que su nivel de glucemia est bajando (hipoglucemia asintomtica). Recomendaciones y consejos generales Registro diario de glucemia  Cada vez que controle su nivel de glucemia, anote  el resultado. Tambin anote aquellos factores que puedan estar afectando su nivel de glucemia, como la dieta y la actividad fsica Dietitian. Esta informacin puede ayudarlos a usted y al pediatra a: Architect patrones en su glucemia  durante el transcurso del [place]. Ajustar su plan de control de la diabetes segn sea necesario. Averige si su medidor le permite descargar sus registros en una computadora o si hay una aplicacin para Chief Executive Officer. [employer] de los medidores de glucosa guardan un registro de las lecturas realizadas con el medidor. Si usted tiene diabetes tipo 1: Contrlese la glucemia 4 o ms veces por da si recibe tratamiento intensivo con insulina con mltiples inyecciones diarias o si [place] una bomba de insulina. Controle su nivel de glucemia: Antes de cada comida y colacin. Antes de ir a acostarse. Tambin controle su nivel de glucemia: Si tiene sntomas de hipoglucemia. Despus de tratar un nivel bajo de glucemia. Antes de realizar [employer] generan un riesgo de lesiones, como conducir o usar [place]. Antes y despus de hacer ejercicio. Dos horas despus de una comida. Ocasionalmente, entre las 2:00 a. m. y las 3:00 a. m., como se lo hayan indicado. Es posible que deba controlarse la glucemia con ms frecuencia, de 6 a 10 veces por da, si: Tiene diabetes que no est bien controlada. Est enfermo. Tiene antecedentes de hipoglucemia grave. Tiene hipoglucemia asintomtica. Si usted tiene diabetes tipo 2: Contrlese la glucemia 2 o ms veces al da si se aplica insulina u otros medicamentos para la diabetes. Contrlese la glucemia 4 o ms veces al da si recibe tratamiento intensivo con insulina. Ocasionalmente, tambin es posible que deba controlarse la glucosa entre las 2:00 a. m. y las 3:00 a. m., como se lo hayan indicado. Tambin controle su nivel de glucemia: Antes y despus de hacer ejercicio. Antes de realizar [employer] generan un riesgo de lesiones, como conducir o usar [place]. Es posible que Chemical engineer con ms frecuencia su nivel de glucemia si: Necesita ajustar la dosis de sus medicamentos. Su diabetes no est bien controlada. Est enfermo. Consejos  generales Asegrese de tener siempre a mano sus suministros. Despus de usar algunas cajas de tiras reactivas, ajuste (calibre) el medidor de glucemia segn las instrucciones del medidor. Todos los medidores de glucemia incluyen un nmero de telfono directo, disponible las 24 horas, al que podr llamar si tiene preguntas o [place]. Adems, comunquese con su mdico si tiene alguna pregunta o inquietud. Dnde buscar ms informacin American Diabetes Association (Asociacin Estadounidense de la Diabetes): www.diabetes.org The Association of Diabetes Care & Education Specialists (Asociacin de Especialistas en Atencin y Educacin sobre la Diabetes): www.diabeteseducator.org Comunquese con un mdico si: El nivel de glucemia es mayor o igual que 240 mg/dl (68.0 mmol/dl) durante 2 das seguidos. Ha estado enfermo o ha tenido fiebre durante 2 [address] o ms, y no [place]. Tiene alguno de los siguientes problemas durante ms de 6 horas: No puede comer ni beber. Tiene nuseas o vmitos. Tiene diarrea. Solicite ayuda de inmediato si: Su nivel de glucemia est por debajo de 54 mg/dl (3 mmol/l). Siente confusin o tiene dificultad para pensar con claridad. Tiene dificultad para respirar. Tiene un nivel moderado o alto de cetonas en la [place]. Estos sntomas pueden representar un problema grave que constituye Radio broadcast assistant. No espere a ver si los sntomas desaparecen. Solicite atencin mdica de inmediato. Comunquese con el servicio de emergencias de su localidad (911 en los [place]). No conduzca por sus propios medios  [facility]. Resumen Controlar su glucemia es una parte importante del tratamiento de su diabetes. El control de la glucemia implica realizar controles regulares como se lo hayan indicado, y Midwife un registro o diario de los [place] a lo largo del [place]. El [employer] objetivos personalizados de su tratamiento. Los objetivos estarn basados en su edad, en  otras afecciones que tenga y en cmo responde al tratamiento de la diabetes. Cada vez que controle su nivel de glucemia, anote el resultado. Tambin anote aquellos factores que puedan estar afectando su glucemia, como la dieta y la actividad fsica Dietitian. Esta informacin no tiene Theme park manager el consejo del mdico. Asegrese de hacerle al mdico cualquier pregunta que tenga. Document Revised: 11/15/2019 Document Reviewed: 11/15/2019 Elsevier Patient Education  2022 [employer].

## 2020-10-10 DIAGNOSIS — E1165 Type 2 diabetes mellitus with hyperglycemia: Secondary | ICD-10-CM | POA: Insufficient documentation

## 2020-10-10 DIAGNOSIS — Z8616 Personal history of COVID-19: Secondary | ICD-10-CM | POA: Insufficient documentation

## 2020-10-10 DIAGNOSIS — R011 Cardiac murmur, unspecified: Secondary | ICD-10-CM | POA: Insufficient documentation

## 2020-11-14 ENCOUNTER — Ambulatory Visit: Payer: Self-pay | Admitting: Family Medicine

## 2022-06-19 ENCOUNTER — Encounter: Payer: Self-pay | Admitting: Physician Assistant

## 2022-06-22 ENCOUNTER — Ambulatory Visit
Admission: EM | Admit: 2022-06-22 | Discharge: 2022-06-22 | Disposition: A | Discharge status: Home / Self Care | Payer: Self-pay | Attending: Internal Medicine | Admitting: Internal Medicine

## 2022-06-22 DIAGNOSIS — R350 Frequency of micturition: Secondary | ICD-10-CM | POA: Insufficient documentation

## 2022-06-22 DIAGNOSIS — Z7984 Long term (current) use of oral hypoglycemic drugs: Secondary | ICD-10-CM

## 2022-06-22 DIAGNOSIS — E119 Type 2 diabetes mellitus without complications: Secondary | ICD-10-CM | POA: Insufficient documentation

## 2022-06-22 DIAGNOSIS — E1165 Type 2 diabetes mellitus with hyperglycemia: Secondary | ICD-10-CM | POA: Insufficient documentation

## 2022-06-22 LAB — POCT URINALYSIS DIP (MANUAL ENTRY)
Bilirubin, UA: NEGATIVE
Glucose, UA: NEGATIVE mg/dL
Ketones, POC UA: NEGATIVE mg/dL
Nitrite, UA: POSITIVE — AB
Protein Ur, POC: NEGATIVE mg/dL
Spec Grav, UA: 1.02 (ref 1.010–1.025)
Urobilinogen, UA: 1 E.U./dL
pH, UA: 7 (ref 5.0–8.0)

## 2022-06-22 LAB — POCT FASTING CBG KUC MANUAL ENTRY: POCT Glucose (KUC): 305 mg/dL — AB (ref 70–99)

## 2022-06-22 MED ORDER — METFORMIN HCL 500 MG PO TABS: 500.0000 mg | ORAL_TABLET | Freq: Two times a day (BID) | ORAL | 0 refills | Status: AC

## 2022-06-22 MED ORDER — CEPHALEXIN 500 MG PO CAPS: 500.0000 mg | ORAL_CAPSULE | Freq: Two times a day (BID) | ORAL | 0 refills | Status: AC

## 2022-06-22 NOTE — ED Provider Notes (Signed)
[facility]    CSN: [id number] Arrival date & time: 06/22/22  1236      History   Chief Complaint Chief Complaint  Patient presents with   Medication Refill    HPI Beth Robles is a 88 y.o. female.   Patient presents with her daughter who provides majority of history given patient's history of memory issues.  Daughter reports they are here today to get medication refills for "memory medicine" and 2 medications for diabetes.  Reports that she has been out of all these medications for approximately 2 weeks.  Daughter reports that they recently had PCP visit at the beginning of May where they were referred to neurology for a medication for memory.  Although, daughter is requesting medication refill today.  She is not sure the name of the medication.  Reports that this medication was prescribed in [place].  Daughter reports that she takes metformin for diabetes and another medication that she is not sure the name of which is 5 mg.  Reports that she was started on these medications in [place] as well.  Checked her blood sugar at home today and it was 400.  Reports that she otherwise has been well with no nausea, vomiting, abdominal pain.  Although, she has been having some urinary frequency mainly at nighttime over the past few days.  Denies urinary burning.  Daughter reports they have a PCP visit in June.   Medication Refill   Past Medical History:  Diagnosis Date   Diabetes mellitus without complication [facility])     Patient Active Problem List   Diagnosis Date Noted   Murmur, cardiac 10/10/2020   History of COVID-19 10/10/2020   Uncontrolled type 2 diabetes mellitus with hyperglycemia (HCC) 10/10/2020    Past Surgical History:  Procedure Laterality Date   NO PAST SURGERIES      OB History     Gravida      Para      Term      Preterm      AB      Living  1      SAB      IAB      Ectopic      Multiple      Live Births               Home  Medications    Prior to Admission medications   Medication Sig Start Date End Date Taking? Authorizing Provider  cephALEXin (KEFLEX) 500 MG capsule Take 1 capsule (500 mg total) by mouth 2 (two) times daily for 7 days. 06/22/22 06/29/22 Yes Kazumi Lachney, Acie Fredrickson, FNP  glucose blood (TRUE METRIX BLOOD GLUCOSE TEST) test strip Use twice daily 10/09/20  Yes Mayers, Cari S, PA-C  TRUEplus Lancets 28G MISC Use twice daily 10/09/20  Yes Mayers, Cari S, PA-C  azithromycin (ZITHROMAX) 250 MG tablet Take 2 tabs by mouth day 1, then take 1 tab once daily 10/09/20   Mayers, Cari S, PA-C  benzonatate (TESSALON) 100 MG capsule Take 1 capsule (100 mg total) by mouth 2 (two) times daily as needed for cough. 10/09/20   Mayers, Cari S, PA-C  metFORMIN (GLUCOPHAGE) 500 MG tablet Take 1 tablet (500 mg total) by mouth 2 (two) times daily with a meal. 06/22/22   Gustavus Bryant, FNP    Family History History reviewed. No pertinent family history.  Social History Social History   Tobacco Use   Smoking status: Never   Smokeless tobacco: Never  Vaping Use   Vaping Use: Never used  Substance Use Topics   Alcohol use: No   Drug use: No     Allergies   Patient has no known allergies.   Review of Systems Review of Systems Per HPI  Physical Exam Triage Vital Signs ED Triage Vitals  Enc Vitals Group     BP 06/22/22 1323 131/69     Pulse Rate 06/22/22 1323 76     Resp 06/22/22 1323 16     Temp 06/22/22 1323 98.4 F (36.9 C)     Temp Source 06/22/22 1323 Oral     SpO2 06/22/22 1323 94 %     Weight 06/22/22 1320 110 lb (49.9 kg)     Height 06/22/22 1320 4\' 10"  (1.473 m)     Head Circumference --      Peak Flow --      Pain Score 06/22/22 1319 0     Pain Loc --      Pain Edu? --      Excl. in GC? --    No data found.  Updated Vital Signs BP 131/69 (BP Location: Left Arm)   Pulse 76   Temp 98.4 F (36.9 C) (Oral)   Resp 16   Ht 4\' 10"  (1.473 m)   Wt 110 lb (49.9 kg)   SpO2 94%   BMI 22.99 kg/m    Visual Acuity Right Eye Distance:   Left Eye Distance:   Bilateral Distance:    Right Eye Near:   Left Eye Near:    Bilateral Near:     Physical Exam Constitutional:      General: She is not in acute distress.    Appearance: Normal appearance. She is not toxic-appearing or diaphoretic.  HENT:     Head: Normocephalic and atraumatic.  Eyes:     Extraocular Movements: Extraocular movements intact.     Conjunctiva/sclera: Conjunctivae normal.  Pulmonary:     Effort: Pulmonary effort is normal.  Neurological:     General: No focal deficit present.     Mental Status: She is alert and oriented to person, place, and time. Mental status is at baseline.  Psychiatric:        Mood and Affect: Mood normal.        Behavior: Behavior normal.        Thought Content: Thought content normal.        Judgment: Judgment normal.      UC Treatments / Results  Labs (all labs ordered are listed, but only abnormal results are displayed) Labs Reviewed  POCT URINALYSIS DIP (MANUAL ENTRY) - Abnormal; Notable for the following components:      Result Value   Clarity, UA hazy (*)    Blood, UA small (*)    Nitrite, UA Positive (*)    Leukocytes, UA Small (1+) (*)    All other components within normal limits  POCT FASTING CBG KUC MANUAL ENTRY - Abnormal; Notable for the following components:   POCT Glucose (K[facility]) 305 (*)    All other components within normal limits  URINE CULTURE    EKG   Radiology No results found.  Procedures Procedures (including critical care time)  Medications Ordered in UC Medications - No data to display  Initial Impression / Assessment and Plan / UC Course  I have reviewed the triage vital signs and the nursing notes.  Pertinent labs & imaging results that were available during my care of the patient were reviewed by  me and considered in my medical decision making (see chart for details).     She had a PCP visit on 06/12/2022.  It appears that PCP reported  they would attempt to refill diabetes medications.  Chart was unclear whether or not medications were refilled but the medication listed was metformin.  Therefore, will refill at this previously prescribed dose given blood sugar is quite elevated at 300 today.  Daughter is not sure the name of the other diabetes medication and given I am not able to determine which medication this is, recommended for them to follow-up with PCP for this.  Daughter is requesting refill on memory medication but given she is not sure the name of this and neurology referral has been placed, recommended to follow-up with them for this refill.  Advised monitoring blood sugar very closely at home as well.  No signs of DKA on exam as urine is not showing any signs and patient is not having any systemic symptoms.  UA does show white blood cells and with urinary frequency, will opt to treat with cephalexin.  Creatinine clearance is 41 so no dosage adjustment necessary.  Urinary frequency is most likely related to blood sugar but urine culture pending.  Advised strict follow-up with PCP and neurology.  Daughter verbalized understanding and was agreeable with plan.  Interpreter used throughout patient interaction. Final Clinical Impressions(s) / UC Diagnoses   Final diagnoses:  Type 2 diabetes mellitus without complication, without long-term current use of insulin (HCC)  Urinary frequency     Discharge Instructions      I have refilled metformin.  Please monitor blood sugar closely and follow-up with PCP.  Urine is showing signs of possible UTI so I prescribed an antibiotic.  Urine culture is pending.  Will call when it results in a few days.    ED Prescriptions     Medication Sig Dispense Auth. Provider   metFORMIN (GLUCOPHAGE) 500 MG tablet Take 1 tablet (500 mg total) by mouth 2 (two) times daily with a meal. 30 tablet [place], [place], [place]   cephALEXin (KEFLEX) 500 MG capsule Take 1 capsule (500 mg total) by mouth 2 (two)  times daily for 7 days. 14 capsule [place], Acie Fredrickson, [place]      PDMP not reviewed this encounter.   Gustavus Bryant, [place] 06/22/22 1447

## 2022-06-22 NOTE — ED Triage Notes (Signed)
Pt's daughter states that pt has a history of Diabetes and is looking to get patient a prescription for diabetes.  Pt hasn't had an appt with pcp since 2022. Daughter states that pt's blood sugar was 400 this morning.

## 2022-06-22 NOTE — Discharge Instructions (Signed)
I have refilled metformin.  Please monitor blood sugar closely and follow-up with PCP.  Urine is showing signs of possible UTI so I prescribed an antibiotic.  Urine culture is pending.  Will call when it results in a few days.

## 2022-06-24 LAB — URINE CULTURE: Culture: >=100000 — AB

## 2022-07-02 ENCOUNTER — Ambulatory Visit: Payer: Self-pay

## 2022-07-02 ENCOUNTER — Ambulatory Visit (INDEPENDENT_AMBULATORY_CARE_PROVIDER_SITE_OTHER): Payer: PRIVATE HEALTH INSURANCE | Admitting: Physician Assistant

## 2022-07-02 ENCOUNTER — Encounter: Payer: Self-pay | Admitting: Physician Assistant

## 2022-07-02 ENCOUNTER — Other Ambulatory Visit (INDEPENDENT_AMBULATORY_CARE_PROVIDER_SITE_OTHER): Payer: PRIVATE HEALTH INSURANCE

## 2022-07-02 VITALS — BP 138/80 | HR 74 | Resp 20 | Ht <= 58 in | Wt 110.0 lb

## 2022-07-02 DIAGNOSIS — R413 Other amnesia: Secondary | ICD-10-CM

## 2022-07-02 LAB — VITAMIN B12: Vitamin B-12: 668 pg/mL (ref 211–911)

## 2022-07-02 LAB — TSH: TSH: 1.87 u[IU]/mL (ref 0.35–5.50)

## 2022-07-02 MED ORDER — DIVALPROEX SODIUM 125 MG PO DR TAB: DELAYED_RELEASE_TABLET | ORAL | 3 refills | Status: AC

## 2022-07-02 NOTE — Progress Notes (Signed)
Assessment/Plan:    The patient is seen in neurologic consultation at the request of [name], PA-C for the evaluation of memory.  Beth Robles is a very pleasant 88 y.o. year old RH female with a history of hypertension, hyperlipidemia, seen today for evaluation of memory loss. MMSE today is 4/30. Dementia is likely due to Alzheimer's etiology, although workup is in progress. She has recurrent UTIs,abnormal A1C not on meds and  behavioral changes requiring 24/7 monitoring. At this moment daughter prefers to care for her at home.    Dementia, advanced, with behavioral disturbance  MRI brain without contrast to assess for underlying structural abnormality and assess vascular load   No indication for antidementia medication, due to advanced disease, as the side effects and risk outweighing the benefits of the medicine. Check B12, TSH Start Depakote 125 mg at night for sundowning, may increase to 125 mg bid.  Recommend establishing PCP care Continue to control cardiovascular risk factors [place] up in 3 months    Subjective:    The patient is accompanied by her daughter  who supplements the history.    How long did patient have memory difficulties?  For at least 4-5 years . While visiting [place], her Gyn gave her  a prescription for memory loss but she cannot remember the name. She remembers being between 40-50 dollars . About 1 year ago, daughter reports that she was having conversations with herself. Her son in [place] was concerned about her wandering tendencies and took her to the same doctor gave her the same medicine. She remained in [place] till March 2024 but when her daughter noticed that she was forgetting her own name, but daughter to be with [place].  She felt that her children in [place] have lost their patients and wanted to take over her care here.  Daughter is also concerned that her sugars are elevated, no one took care of that, which indeed may have  contributed to some of her confusional state.  She is about to establish care with PCP here. repeats oneself?  Endorsed.  Disoriented when walking into a room?  Sometimes she thinks she is in [place].   Leaving objects in unusual places? Endorsed.   Wandering behavior?  As mentioned above, she has moments in which she is constantly packing her bags, and waiting outside to be picked Because "the voices told me to do it ".  Sometimes she sees people that tell her to pack her bags as well.  There were several instances in which she walked around town in [place] but since he is a Engineer, agricultural town and everybody knows her, they brought her home. Any personality changes ?  She does have moments of agitation when confused. Any history of depression?: Endorsed, after the death of her husband and her grandson's death.   Hallucinations or paranoia?  "They tell her to do things, sometimes is family and sometimes people she does not know ".  There was one episode in which she said to her daughter she is going to get married and the voices told her to pack for her engagement party Seizures? denies    Any sleep changes? There are nights that she spends awake due to hallucinations.  It is not clear if she has vivid dreams or REM behavior vivid dreams, REM behavior. Sleep apnea? denies   Any hygiene concerns?  denies   Independent of bathing and dressing?  Endorsed  Does the patient need help with  medications?  Daughter  Who is in charge of the finances?  Daughter is in charge     Any changes in appetite? Very well. She forgets that she ate and eats again      Patient have trouble swallowing?  denies   Does the patient cook?  Yes, with supervision  Any headaches?  denies   Chronic back pain?  denies   Ambulates with difficulty? denies   Recent falls or head injuries?    Vision changes? Denies Unilateral weakness, numbness or tingling?  denies   Any tremors?  denies   Any anosmia?  denies   Any incontinence of  urine?. Recent Ecoli UTI, took some antibiotics that her friend gave her.  She needs pads but does not like to use them. Daughter reports some incontinence. Any bowel dysfunction? Occasionally she has diarrhea    Patient lives with her daughter     History of heavy alcohol intake? denies   History of heavy tobacco use? denies   Family history of dementia?  No      Does patient drive? No  No Known Allergies  Current Outpatient Medications  Medication Instructions   azithromycin (ZITHROMAX) 250 MG tablet Take 2 tabs by mouth day 1, then take 1 tab once daily   benzonatate (TESSALON) 100 mg, Oral, 2 times daily PRN   divalproex (DEPAKOTE) 125 MG DR tablet Take 1 tab at night   glucose blood (TRUE METRIX BLOOD GLUCOSE TEST) test strip Use twice daily   metFORMIN (GLUCOPHAGE) 500 mg, Oral, 2 times daily with meals   TRUEplus Lancets 28G MISC Use twice daily     VITALS:   Vitals:   07/02/22 0751  BP: 138/80  Pulse: 74  Resp: 20  SpO2: 98%  Weight: 110 lb (49.9 kg)  Height: 4\' 10"  (1.473 m)      PHYSICAL EXAM   HEENT:  Normocephalic, atraumatic. The mucous membranes are moist. The superficial temporal arteries are without ropiness or tenderness. Cardiovascular: Regular rate and rhythm. Lungs: Clear to auscultation bilaterally. Neck: There are no carotid bruits noted bilaterally.  NEUROLOGICAL:     No data to display             07/02/2022   12:00 PM  MMSE - Mini Mental State Exam  Orientation to time 0  Orientation to Place 0  Registration 2  Attention/ Calculation 0  Recall 0  Language- name 2 objects 1  Language- repeat 0  Language- follow 3 step command 0  Language- read & follow direction 1  Write a sentence 0  Copy design 0  Total score 4     Orientation:  Alert and not oriented to person, place and time. No aphasia or dysarthria. Fund of knowledge is reduced.  Recent and remote memory impaired, attention and concentration are reduced.  Unable to name  objects and able to 0/3 repeat phrases. Delayed recall 0/3 Cranial nerves: There is good facial symmetry. Extraocular muscles are intact and visual fields are full to confrontational testing. Speech is fluent and clear. no tongue deviation. Hearing is intact to conversational tone.  Tone: Tone is good throughout. Sensation: Sensation is intact to light touch and pinprick throughout. Vibration is intact at the bilateral big toe  Coordination: The patient has no difficulty with RAM's or FNF bilaterally. Normal finger to nose  Motor: Strength is 5/5 in the bilateral upper and lower extremities. There is no pronator drift. There are no fasciculations noted. DTR's: Deep tendon reflexes are  2/4 at the bilateral biceps, triceps, brachioradialis, patella and achilles.  Plantar responses are downgoing bilaterally. Gait and Station: The patient is able to ambulate without difficulty. The patient is able to ambulate in a tandem fashion, able to stand in the Romberg position.     Thank you for allowing [place] the opportunity to participate in the care of this nice patient. Please do not hesitate to contact us for any questions or concerns.   Total time spent on today's visit was 65 minutes dedicated to this patient today, preparing to see patient, examining the patient, ordering tests and/or medications and counseling the patient, documenting clinical information in the EHR or other health record, independently interpreting results and communicating results to the patient/family, discussing treatment and goals, answering patient's questions and coordinating care.  Cc:  Pcp, No  Marlowe Kays 07/02/2022 12:28 PM

## 2022-07-02 NOTE — Patient Instructions (Addendum)
It was a pleasure to see you today at our office.   Recommendations:    MRI of the brain, the radiology office will call you to arrange you appointment   Check labs today   Establish care with primary doctor for diabetes, y otros probremas  Follow up in 3-4 meses  Start Depakote 125 mg at night, may increase one tab  twice a day for hallucinations      For assessment of decision of mental capacity and competency:  Call Dr. Erick Blinks, geriatric psychiatrist at [phone] Counseling regarding caregiver distress, including caregiver depression, anxiety and issues regarding community resourc  Whom to call: Memory  decline, memory medications: Call our office [phone]  For psychiatric meds, mood meds: Please have your primary care physician manage these medications.  If you have any severe symptoms of a stroke, or other severe issues such as confusion,severe chills or fever, etc call 911 or go to the ER as you may need to be evaluated further    RECOMMENDATIONS FOR ALL PATIENTS WITH MEMORY PROBLEMS: 1. Continue to exercise (Recommend 30 minutes of walking everyday, or 3 hours every week) 2. Increase social interactions - continue going to [place] and enjoy social gatherings with friends and family 3. Eat healthy, avoid fried foods and eat more fruits and vegetables 4. Maintain adequate blood pressure, blood sugar, and blood cholesterol level. Reducing the risk of stroke and cardiovascular disease also helps promoting better memory. 5. Avoid stressful situations. Live a simple life and avoid aggravations. Organize your time and prepare for the next day in anticipation. 6. Sleep well, avoid any interruptions of sleep and avoid any distractions in the bedroom that may interfere with adequate sleep quality 7. Avoid sugar, avoid sweets as there is a strong link between excessive sugar intake, diabetes, and cognitive impairment We discussed the Mediterranean diet, which has been shown  to help patients reduce the risk of progressive memory disorders and reduces cardiovascular risk. This includes eating fish, eat fruits and green leafy vegetables, nuts like almonds and hazelnuts, walnuts, and also use olive oil. Avoid fast foods and fried foods as much as possible. Avoid sweets and sugar as sugar use has been linked to worsening of memory function.  There is always a concern of gradual progression of memory problems. If this is the case, then we may need to adjust level of care according to patient needs. Support, both to the patient and caregiver, should then be put into place.      You have been referred for a neuropsychological evaluation (i.e., evaluation of memory and thinking abilities). Please bring someone with you to this appointment if possible, as it is helpful for the doctor to hear from both you and another adult who knows you well. Please bring eyeglasses and hearing aids if you wear them.    The evaluation will take approximately 3 hours and has two parts:   The first part is a clinical interview with the neuropsychologist (Dr. Milbert Coulter or Dr. Roseanne Reno). During the interview, the neuropsychologist will speak with you and the individual you brought to the appointment.    The second part of the evaluation is testing with the doctor's technician Annabelle Harman or Selena Batten). During the testing, the technician will ask you to remember different types of material, solve problems, and answer some questionnaires. Your family member will not be present for this portion of the evaluation.   Please note: We must reserve several hours of the neuropsychologist's time and the psychometrician's  time for your evaluation appointment. As such, there is a No-Show fee of $100. If you are unable to attend any of your appointments, please contact our office as soon as possible to reschedule.    FALL PRECAUTIONS: Be cautious when walking. Scan the area for obstacles that may increase the risk of trips and  falls. When getting up in the mornings, sit up at the edge of the bed for a few minutes before getting out of bed. Consider elevating the bed at the head end to avoid drop of blood pressure when getting up. Walk always in a well-lit room (use night lights in the walls). Avoid area rugs or power cords from appliances in the middle of the walkways. Use a walker or a cane if necessary and consider physical therapy for balance exercise. Get your eyesight checked regularly.  FINANCIAL OVERSIGHT: Supervision, especially oversight when making financial decisions or transactions is also recommended.  HOME SAFETY: Consider the safety of the kitchen when operating appliances like stoves, microwave oven, and blender. Consider having supervision and share cooking responsibilities until no longer able to participate in those. Accidents with firearms and other hazards in the house should be identified and addressed as well.   ABILITY TO BE LEFT ALONE: If patient is unable to contact 911 operator, consider using LifeLine, or when the need is there, arrange for someone to stay with patients. Smoking is a fire hazard, consider supervision or cessation. Risk of wandering should be assessed by caregiver and if detected at any point, supervision and safe proof recommendations should be instituted.  MEDICATION SUPERVISION: Inability to self-administer medication needs to be constantly addressed. Implement a mechanism to ensure safe administration of the medications.   DRIVING: Regarding driving, in patients with progressive memory problems, driving will be impaired. We advise to have someone else do the driving if trouble finding directions or if minor accidents are reported. Independent driving assessment is available to determine safety of driving.   If you are interested in the driving assessment, you can contact the following:  [facility] [employer] in [place] [phone]  [facility]  [phone]  [facility] [phone] or [phone]    Mediterranean Diet A Mediterranean diet refers to food and lifestyle choices that are based on the traditions of countries located on the [employer]. This way of eating has been shown to help prevent certain conditions and improve outcomes for people who have chronic diseases, like kidney disease and heart disease. What are tips for following this plan? Lifestyle  Cook and eat meals together with your family, when possible. Drink enough fluid to keep your urine clear or pale yellow. Be physically active every day. This includes: Aerobic exercise like running or swimming. Leisure activities like gardening, walking, or housework. Get 7-8 hours of sleep each night. If recommended by your health care provider, drink red wine in moderation. This means 1 glass a day for nonpregnant women and 2 glasses a day for men. A glass of wine equals 5 oz (150 mL). Reading food labels  Check the serving size of packaged foods. For foods such as rice and pasta, the serving size refers to the amount of cooked product, not dry. Check the total fat in packaged foods. Avoid foods that have saturated fat or trans fats. Check the ingredients list for added sugars, such as corn syrup. Shopping  At the grocery store, buy most of your food from the areas near the walls of the  store. This includes: Fresh fruits and vegetables (produce). Grains, beans, nuts, and seeds. Some of these may be available in unpackaged forms or large amounts (in bulk). Fresh seafood. Poultry and eggs. Low-fat dairy products. Buy whole ingredients instead of prepackaged foods. Buy fresh fruits and vegetables in-season from local farmers markets. Buy frozen fruits and vegetables in resealable bags. If you do not have access to quality fresh seafood, buy precooked frozen shrimp or canned fish, such as tuna, salmon, or sardines. Buy  small amounts of raw or cooked vegetables, salads, or olives from the deli or salad bar at your store. Stock your pantry so you always have certain foods on hand, such as olive oil, canned tuna, canned tomatoes, rice, pasta, and beans. Cooking  Cook foods with extra-virgin olive oil instead of using butter or other vegetable oils. Have meat as a side dish, and have vegetables or grains as your main dish. This means having meat in small portions or adding small amounts of meat to foods like pasta or stew. Use beans or vegetables instead of meat in common dishes like chili or lasagna. Experiment with different cooking methods. Try roasting or broiling vegetables instead of steaming or sauteing them. Add frozen vegetables to soups, stews, pasta, or rice. Add nuts or seeds for added healthy fat at each meal. You can add these to yogurt, salads, or vegetable dishes. Marinate fish or vegetables using olive oil, lemon juice, garlic, and fresh herbs. Meal planning  Plan to eat 1 vegetarian meal one day each week. Try to work up to 2 vegetarian meals, if possible. Eat seafood 2 or more times a week. Have healthy snacks readily available, such as: Vegetable sticks with hummus. Greek yogurt. Fruit and nut trail mix. Eat balanced meals throughout the week. This includes: Fruit: 2-3 servings a day Vegetables: 4-5 servings a day Low-fat dairy: 2 servings a day Fish, poultry, or lean meat: 1 serving a day Beans and legumes: 2 or more servings a week Nuts and seeds: 1-2 servings a day Whole grains: 6-8 servings a day Extra-virgin olive oil: 3-4 servings a day Limit red meat and sweets to only a few servings a month What are my food choices? Mediterranean diet Recommended Grains: Whole-grain pasta. Brown rice. Bulgar wheat. Polenta. Couscous. Whole-wheat bread. Orpah Cobb. Vegetables: Artichokes. Beets. Broccoli. Cabbage. Carrots. Eggplant. Green beans. Chard. Kale. Spinach. Onions. Leeks. Peas.  Squash. Tomatoes. Peppers. Radishes. Fruits: Apples. Apricots. Avocado. Berries. Bananas. Cherries. Dates. Figs. Grapes. Lemons. Melon. Oranges. Peaches. Plums. Pomegranate. Meats and other protein foods: Beans. Almonds. Sunflower seeds. Pine nuts. Peanuts. Cod. Salmon. Scallops. Shrimp. Tuna. Tilapia. Clams. Oysters. Eggs. Dairy: Low-fat milk. Cheese. Greek yogurt. Beverages: Water. Red wine. Herbal tea. Fats and oils: Extra virgin olive oil. Avocado oil. Grape seed oil. Sweets and desserts: [place] yogurt with honey. Baked apples. Poached pears. Trail mix. Seasoning and other foods: Basil. Cilantro. Coriander. Cumin. Mint. Parsley. Sage. Rosemary. Tarragon. Garlic. Oregano. Thyme. Pepper. Balsalmic vinegar. Tahini. Hummus. Tomato sauce. Olives. Mushrooms. Limit these Grains: Prepackaged pasta or rice dishes. Prepackaged cereal with added sugar. Vegetables: Deep fried potatoes (french fries). Fruits: Fruit canned in syrup. Meats and other protein foods: Beef. Pork. Lamb. Poultry with skin. Hot dogs. Tomasa Blase. Dairy: Ice cream. Sour cream. Whole milk. Beverages: Juice. Sugar-sweetened soft drinks. Beer. Liquor and spirits. Fats and oils: Butter. Canola oil. Vegetable oil. Beef fat (tallow). Lard. Sweets and desserts: Cookies. Cakes. Pies. Candy. Seasoning and other foods: Mayonnaise. Premade sauces and marinades. The items listed may not  be a complete list. Talk with your dietitian about what dietary choices are right for you. Summary The Mediterranean diet includes both food and lifestyle choices. Eat a variety of fresh fruits and vegetables, beans, nuts, seeds, and whole grains. Limit the amount of red meat and sweets that you eat. Talk with your health care provider about whether it is safe for you to drink red wine in moderation. This means 1 glass a day for nonpregnant women and 2 glasses a day for men. A glass of wine equals 5 oz (150 mL). This information is not intended to replace advice  given to you by your health care provider. Make sure you discuss any questions you have with your health care provider. Document Released: 09/13/2015 Document Revised: 10/16/2015 Document Reviewed: 09/13/2015 Elsevier Interactive Patient Education  2017 [employer].    Labs today [address] Mri at [facility] [phone]

## 2022-07-22 ENCOUNTER — Other Ambulatory Visit: Payer: Self-pay | Admitting: Physician Assistant

## 2022-07-22 DIAGNOSIS — Z13820 Encounter for screening for osteoporosis: Secondary | ICD-10-CM
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
## 2022-08-06 ENCOUNTER — Ambulatory Visit: Payer: Self-pay | Admitting: Nurse Practitioner

## 2022-09-15 ENCOUNTER — Ambulatory Visit: Payer: Self-pay | Admitting: Physician Assistant

## 2023-02-05 ENCOUNTER — Inpatient Hospital Stay: Admission: RE | Admit: 2023-02-05 | Payer: PRIVATE HEALTH INSURANCE | Source: Ambulatory Visit
